# Patient Record
Sex: Male | Born: 1975 | Race: Black or African American | Hispanic: No | Marital: Married | Smoking: Current every day smoker
Health system: Southern US, Community
[De-identification: ages and names within clinical notes are randomized; demographics above are authoritative.]

## PROBLEM LIST (undated history)

## (undated) HISTORY — PX: ACHILLES TENDON REPAIR: SUR1153

---

## 1998-04-16 HISTORY — PX: PATELLAR TENDON REPAIR: SHX737

## 2002-03-23 ENCOUNTER — Emergency Department (HOSPITAL_COMMUNITY): Admission: EM | Admit: 2002-03-23 | Discharge: 2002-03-23 | Payer: Self-pay | Admitting: Emergency Medicine

## 2002-05-05 ENCOUNTER — Emergency Department (HOSPITAL_COMMUNITY): Admission: EM | Admit: 2002-05-05 | Discharge: 2002-05-05 | Payer: Self-pay | Admitting: Emergency Medicine

## 2002-05-07 ENCOUNTER — Emergency Department (HOSPITAL_COMMUNITY): Admission: EM | Admit: 2002-05-07 | Discharge: 2002-05-07 | Payer: Self-pay | Admitting: Emergency Medicine

## 2002-05-11 ENCOUNTER — Emergency Department (HOSPITAL_COMMUNITY): Admission: EM | Admit: 2002-05-11 | Discharge: 2002-05-11 | Payer: Self-pay | Admitting: Emergency Medicine

## 2002-12-14 ENCOUNTER — Emergency Department (HOSPITAL_COMMUNITY): Admission: EM | Admit: 2002-12-14 | Discharge: 2002-12-14 | Payer: Self-pay | Admitting: Emergency Medicine

## 2003-07-11 ENCOUNTER — Emergency Department (HOSPITAL_COMMUNITY): Admission: EM | Admit: 2003-07-11 | Discharge: 2003-07-11 | Payer: Self-pay | Admitting: Emergency Medicine

## 2003-07-14 ENCOUNTER — Observation Stay (HOSPITAL_COMMUNITY): Admission: RE | Admit: 2003-07-14 | Discharge: 2003-07-15 | Payer: Self-pay | Admitting: Orthopedic Surgery

## 2004-04-16 HISTORY — PX: HERNIA REPAIR: SHX51

## 2012-09-13 ENCOUNTER — Emergency Department (HOSPITAL_BASED_OUTPATIENT_CLINIC_OR_DEPARTMENT_OTHER)
Admission: EM | Admit: 2012-09-13 | Discharge: 2012-09-13 | Disposition: A | Discharge status: Home / Self Care | Payer: Self-pay | Attending: Emergency Medicine | Admitting: Emergency Medicine

## 2012-09-13 ENCOUNTER — Encounter (HOSPITAL_BASED_OUTPATIENT_CLINIC_OR_DEPARTMENT_OTHER): Payer: Self-pay | Admitting: *Deleted

## 2012-09-13 DIAGNOSIS — R42 Dizziness and giddiness: Secondary | ICD-10-CM | POA: Insufficient documentation

## 2012-09-13 DIAGNOSIS — F1010 Alcohol abuse, uncomplicated: Secondary | ICD-10-CM | POA: Insufficient documentation

## 2012-09-13 DIAGNOSIS — E860 Dehydration: Secondary | ICD-10-CM | POA: Insufficient documentation

## 2012-09-13 LAB — CBC WITH DIFFERENTIAL/PLATELET
Eosinophils Absolute: 0 10*3/uL (ref 0.0–0.7)
HCT: 38 % — ABNORMAL LOW (ref 39.0–52.0)
Hemoglobin: 13 g/dL (ref 13.0–17.0)
Lymphs Abs: 1 10*3/uL (ref 0.7–4.0)
MCH: 34.3 pg — ABNORMAL HIGH (ref 26.0–34.0)
Monocytes Relative: 8 % (ref 3–12)
Neutrophils Relative %: 75 % (ref 43–77)
RBC: 3.79 MIL/uL — ABNORMAL LOW (ref 4.22–5.81)

## 2012-09-13 LAB — BASIC METABOLIC PANEL
BUN: 14 mg/dL (ref 6–23)
Chloride: 99 mEq/L (ref 96–112)
GFR calc non Af Amer: >90 mL/min (ref >90)
Glucose, Bld: 101 mg/dL — ABNORMAL HIGH (ref 70–99)
Potassium: 3.7 mEq/L (ref 3.5–5.1)

## 2012-09-13 MED ORDER — SODIUM CHLORIDE 0.9 % IV BOLUS (SEPSIS): 1000.0000 mL | Freq: Once | INTRAVENOUS | Status: DC

## 2012-09-13 NOTE — ED Notes (Signed)
Patient states that he was feeling dizzy like he was going to pass out. Was walking outside when the feeling came on.

## 2012-09-13 NOTE — ED Provider Notes (Signed)
History     CSN: [id number]  Arrival date & time 09/13/12  1302   First MD Initiated Contact with Patient 09/13/12 1314      No chief complaint on file.   (Consider location/radiation/quality/duration/timing/severity/associated sxs/prior treatment) HPI Patient reports he feels lightheaded accompanied by dry mouth onset upon awakening this morning. He admits to drinking too much alcohol last night.. He denies pain anywhere denies other associated symptoms. Lightheadedness is worse with standing, not improved with anything. No treatment prior to coming here. No past medical history on file. Past medical history negative No past surgical history on file. Surgical history patellar tendon repair No family history on file. Family history negative History  Substance Use Topics  . Smoking status: Not on file  . Smokeless tobacco: Not on file  . Alcohol Use: Not on file     positive smoker occasional alcohol no drug use  Review of Systems  Constitutional: Negative.        Dry mouth  HENT: Negative.   Respiratory: Negative.   Cardiovascular: Negative.   Gastrointestinal: Negative.   Musculoskeletal: Negative.   Skin: Negative.   Neurological: Positive for light-headedness.  Psychiatric/Behavioral: Negative.   All other systems reviewed and are negative.    Allergies  Review of patient's allergies indicates not on file.  Home Medications  No current outpatient prescriptions on file.  There were no vitals taken for this visit.  Physical Exam  Nursing note and vitals reviewed. Constitutional: He is oriented to person, place, and time. He appears well-developed and well-nourished. No distress.  HENT:  Head: Normocephalic and atraumatic.  Dry mucous membranes  Eyes: Conjunctivae are normal. Pupils are equal, round, and reactive to light.  Neck: Neck supple. No tracheal deviation present. No thyromegaly present.  Cardiovascular: Normal rate and regular rhythm.   No  murmur heard. Pulmonary/Chest: Effort normal and breath sounds normal.  Abdominal: Soft. Bowel sounds are normal. He exhibits no distension. There is no tenderness.  Musculoskeletal: Normal range of motion. He exhibits no edema and no tenderness.  Neurological: He is alert and oriented to person, place, and time. No cranial nerve deficit. Coordination normal.  Gait normal  Skin: Skin is warm and dry. No rash noted.  Psychiatric: He has a normal mood and affect.    ED Course  Procedures (including critical care time)  Labs Reviewed - No data to display No results found.   No diagnosis found. Results for orders placed during the hospital encounter of 09/13/12  CBC WITH DIFFERENTIAL      Result Value Range   WBC 6.4  4.0 - 10.5 K/uL   RBC 3.79 (*) 4.22 - 5.81 MIL/uL   Hemoglobin 13.0  13.0 - 17.0 g/dL   HCT 40.9 (*) 81.1 - 91.4 %   MCV 100.3 (*) 78.0 - 100.0 fL   MCH 34.3 (*) 26.0 - 34.0 pg   MCHC 34.2  30.0 - 36.0 g/dL   RDW 78.2  95.6 - 21.3 %   Platelets 131 (*) 150 - 400 K/uL   Neutrophils Relative % 75  43 - 77 %   Neutro Abs 4.8  1.7 - 7.7 K/uL   Lymphocytes Relative 16  12 - 46 %   Lymphs Abs 1.0  0.7 - 4.0 K/uL   Monocytes Relative 8  3 - 12 %   Monocytes Absolute 0.5  0.1 - 1.0 K/uL   Eosinophils Relative 0  0 - 5 %   Eosinophils Absolute 0.0  0.0 -  0.7 K/uL   Basophils Relative 1  0 - 1 %   Basophils Absolute 0.0  0.0 - 0.1 K/uL  BASIC METABOLIC PANEL      Result Value Range   Sodium 137  135 - 145 mEq/L   Potassium 3.7  3.5 - 5.1 mEq/L   Chloride 99  96 - 112 mEq/L   CO2 25  19 - 32 mEq/L   Glucose, Bld 101 (*) 70 - 99 mg/dL   BUN 14  6 - 23 mg/dL   Creatinine, Ser 1.61  0.50 - 1.35 mg/dL   Calcium 9.7  8.4 - 09.6 mg/dL   GFR calc non Af Amer >90  >90 mL/min   GFR calc Af Amer >90  >90 mL/min   No results found.  Patient feels much improved after treatment with oral and intravenous fluids  MDM  Weakness is likely secondary to dehydration. Plan  referral resource guide. Encourage oral hydration. Avoid alcohol for 24 hours Diagnosis dehydration       Doug Sou, MD 09/13/12 [phone]

## 2013-06-07 ENCOUNTER — Encounter (HOSPITAL_BASED_OUTPATIENT_CLINIC_OR_DEPARTMENT_OTHER): Payer: Self-pay | Admitting: Emergency Medicine

## 2013-06-07 ENCOUNTER — Emergency Department (HOSPITAL_BASED_OUTPATIENT_CLINIC_OR_DEPARTMENT_OTHER): Payer: BC Managed Care – PPO

## 2013-06-07 ENCOUNTER — Emergency Department (HOSPITAL_BASED_OUTPATIENT_CLINIC_OR_DEPARTMENT_OTHER)
Admission: EM | Admit: 2013-06-07 | Discharge: 2013-06-07 | Disposition: A | Discharge status: Home / Self Care | Payer: BC Managed Care – PPO | Attending: Emergency Medicine | Admitting: Emergency Medicine

## 2013-06-07 DIAGNOSIS — IMO0002 Reserved for concepts with insufficient information to code with codable children: Secondary | ICD-10-CM

## 2013-06-07 DIAGNOSIS — Z23 Encounter for immunization: Secondary | ICD-10-CM | POA: Insufficient documentation

## 2013-06-07 DIAGNOSIS — Y929 Unspecified place or not applicable: Secondary | ICD-10-CM | POA: Insufficient documentation

## 2013-06-07 DIAGNOSIS — S61209A Unspecified open wound of unspecified finger without damage to nail, initial encounter: Secondary | ICD-10-CM | POA: Insufficient documentation

## 2013-06-07 DIAGNOSIS — F17200 Nicotine dependence, unspecified, uncomplicated: Secondary | ICD-10-CM | POA: Insufficient documentation

## 2013-06-07 DIAGNOSIS — Y9389 Activity, other specified: Secondary | ICD-10-CM | POA: Insufficient documentation

## 2013-06-07 DIAGNOSIS — W2209XA Striking against other stationary object, initial encounter: Secondary | ICD-10-CM | POA: Insufficient documentation

## 2013-06-07 MED ORDER — TETANUS-DIPHTH-ACELL PERTUSSIS 5-2.5-18.5 LF-MCG/0.5 IM SUSP
0.5000 mL | Freq: Once | INTRAMUSCULAR | Status: AC
  Administered 2013-06-07: 0.5 mL via INTRAMUSCULAR
  Filled 2013-06-07: qty 0.5

## 2013-06-07 NOTE — Discharge Instructions (Signed)

## 2013-06-07 NOTE — ED Provider Notes (Signed)
CSN: [id number]     Arrival date & time 06/07/13  1046 History   First MD Initiated Contact with Patient 06/07/13 1108     Chief Complaint  Patient presents with  . Extremity Laceration     (Consider location/radiation/quality/duration/timing/severity/associated sxs/prior Treatment) HPI  This a 38 year old male with no significant past medical history who presents with lacerations to the right hand. Patient reports that approximately 1 AM this morning he punched a mirror and sustained cuts to the right third and fourth digit. He cleaned the cuts. He has noted continued bleeding.  He is left-handed. He is unsure when his last tetanus shot was. He denies any other injury.  No past medical history on file. Past Surgical History  Procedure Laterality Date  . Patellar tendon repair     No family history on file. History  Substance Use Topics  . Smoking status: Current Every Day Smoker  . Smokeless tobacco: Not on file  . Alcohol Use: Yes    Review of Systems  Musculoskeletal:       Hand pain  Skin: Positive for wound.  All other systems reviewed and are negative.      Allergies  Review of patient's allergies indicates no known allergies.  Home Medications  No current outpatient prescriptions on file. BP 155/90  Pulse 98  Temp(Src) 98.8 F (37.1 C) (Oral)  Resp 14  Ht 6\' 1"  (1.854 m)  Wt 180 lb (81.647 kg)  BMI 23.75 kg/m2  SpO2 100% Physical Exam  Nursing note and vitals reviewed. Constitutional: He is oriented to person, place, and time. He appears well-developed and well-nourished.  HENT:  Head: Normocephalic and atraumatic.  Cardiovascular: Normal rate and regular rhythm.   Pulmonary/Chest: Effort normal. No respiratory distress.  Musculoskeletal: He exhibits no edema.  Lymphadenopathy:    He has no cervical adenopathy.  Neurological: He is alert and oriented to person, place, and time.  Skin: Skin is warm and dry.  #1:  Laceration just distal to the PIP  joint on the third digit on the dorsum of the hand, bleeding controlled, proximally 2 cm and V-shaped with flap #2: Laceration just distal to PIP joint on the fourth digit on the dorsal of the right hand, bleeding controlled, approximately 1 cm, flap noted  Psychiatric: He has a normal mood and affect.    ED Course  Procedures (including critical care time) Labs Review Labs Reviewed - No data to display Imaging Review Dg Hand Complete Right  06/07/2013   CLINICAL DATA:  Laceration, trauma  EXAM: RIGHT HAND - COMPLETE 3+ VIEW  COMPARISON:  None.  FINDINGS: Soft tissue lacerations of the third and fourth digits. Small radiopaque foreign body noted within the soft tissue injury of the right third digit overlying the dorsal aspect of the third middle phalanx. No underlying fracture or malalignment.  IMPRESSION: Soft tissue lacerations right third and fourth fingers.  Small punctate radiopaque foreign body right third finger as described  No acute osseous finding   Electronically Signed   By: Ruel Favors M.D.   On: 06/07/2013 11:48    EKG Interpretation   None      LACERATION REPAIR Performed by: Ross Marcus, F Authorized by: Ross Marcus, F Consent: Verbal consent obtained. Risks and benefits: risks, benefits and alternatives were discussed Consent given by: patient Patient identity confirmed: provided demographic data Prepped and Draped in normal sterile fashion Wound explored  Laceration Location: right 3rd, 4th  Laceration Length: 2cm  No Foreign Bodies seen  or palpated  Anesthesia: local infiltration  Local anesthetic: lidocaine 1% wo epinephrine  Anesthetic total: 2 ml  Irrigation method: syringe Amount of cleaning: standard  Skin closure: 5-0 nylon  Number of sutures: 4  Technique: interrupted  Patient tolerance: Patient tolerated the procedure well with no immediate complications.  MDM   Final diagnoses:  Laceration   Patient presents with  lacerations to the right third and fourth digit. He is nontoxic-appearing and no other signs of injury. X-rays are negative for acute fracture and patient is tendons appear to be intact. There is possible foreign body noted on x-ray. Patient's wounds were irrigated and debrided. Upon body noted. Closed loosely as above given injury occurred >10 hrs prior to arrival. Patient's tetanus was updated. He was given strict return precautions. He is to have stitches removed in 7-10 days.  After history, exam, and medical workup I feel the patient has been appropriately medically screened and is safe for discharge home. Pertinent diagnoses were discussed with the patient. Patient was given return precautions.      Shon Batonourtney F Ruston Fedora, MD 06/07/13 [phone]

## 2013-06-07 NOTE — ED Notes (Signed)
Laceration to right middle and ring finger that occurred at 2 am.  Bleeding controlled.

## 2016-12-31 ENCOUNTER — Emergency Department (HOSPITAL_BASED_OUTPATIENT_CLINIC_OR_DEPARTMENT_OTHER): Payer: 59

## 2016-12-31 ENCOUNTER — Encounter (HOSPITAL_BASED_OUTPATIENT_CLINIC_OR_DEPARTMENT_OTHER): Payer: Self-pay

## 2016-12-31 ENCOUNTER — Emergency Department (HOSPITAL_BASED_OUTPATIENT_CLINIC_OR_DEPARTMENT_OTHER)
Admission: EM | Admit: 2016-12-31 | Discharge: 2017-01-01 | Disposition: A | Discharge status: Home / Self Care | Payer: 59 | Attending: Emergency Medicine | Admitting: Emergency Medicine

## 2016-12-31 DIAGNOSIS — K5732 Diverticulitis of large intestine without perforation or abscess without bleeding: Secondary | ICD-10-CM | POA: Insufficient documentation

## 2016-12-31 DIAGNOSIS — R1031 Right lower quadrant pain: Secondary | ICD-10-CM

## 2016-12-31 DIAGNOSIS — F17210 Nicotine dependence, cigarettes, uncomplicated: Secondary | ICD-10-CM | POA: Diagnosis not present

## 2016-12-31 DIAGNOSIS — K5792 Diverticulitis of intestine, part unspecified, without perforation or abscess without bleeding: Secondary | ICD-10-CM

## 2016-12-31 LAB — COMPREHENSIVE METABOLIC PANEL
ALBUMIN: 4.7 g/dL (ref 3.5–5.0)
ALT: 116 U/L — ABNORMAL HIGH (ref 17–63)
ANION GAP: 12 (ref 5–15)
AST: 75 U/L — ABNORMAL HIGH (ref 15–41)
Alkaline Phosphatase: 63 U/L (ref 38–126)
BILIRUBIN TOTAL: 1.9 mg/dL — AB (ref 0.3–1.2)
BUN: 12 mg/dL (ref 6–20)
CO2: 27 mmol/L (ref 22–32)
Calcium: 9.7 mg/dL (ref 8.9–10.3)
Chloride: 92 mmol/L — ABNORMAL LOW (ref 101–111)
Creatinine, Ser: 0.72 mg/dL (ref 0.61–1.24)
Glucose, Bld: 78 mg/dL (ref 65–99)
POTASSIUM: 4.2 mmol/L (ref 3.5–5.1)
Sodium: 131 mmol/L — ABNORMAL LOW (ref 135–145)
TOTAL PROTEIN: 9.3 g/dL — AB (ref 6.5–8.1)

## 2016-12-31 MED ORDER — SODIUM CHLORIDE 0.9 % IV BOLUS (SEPSIS)
1000.0000 mL | Freq: Once | INTRAVENOUS | Status: AC
  Administered 2016-12-31: 1000 mL via INTRAVENOUS

## 2016-12-31 MED ORDER — ONDANSETRON HCL 4 MG/2ML IJ SOLN
4.0000 mg | Freq: Once | INTRAMUSCULAR | Status: AC
  Administered 2016-12-31: 4 mg via INTRAVENOUS
  Filled 2016-12-31: qty 2

## 2016-12-31 MED ORDER — MORPHINE SULFATE (PF) 4 MG/ML IV SOLN
4.0000 mg | Freq: Once | INTRAVENOUS | Status: AC
  Administered 2016-12-31: 4 mg via INTRAVENOUS
  Filled 2016-12-31: qty 1

## 2016-12-31 NOTE — ED Provider Notes (Signed)
[facility]   CSN: [id number] Arrival date & time: 12/31/16  2116     History   Chief Complaint Chief Complaint  Patient presents with  . Abdominal Pain    HPI Benjamin Larson is a 41 y.o. male.  HPI  Benjamin Larson is a 41yo male with no significant past medical history who presents to the Emergency Department for evaluation of right lower quadrant pain. Patient states that pain developed gradually two days ago. Pain feels like a dull ache, but becomes sharp and stabbing when he sits up or rotates at his waist. He also had a tactile fever yesterday and today. He states that he tried taking Bayer back and body pain reliever, which did not help. Last bowel movement was yesterday and normal for him. No nausea, vomiting, diarrhea, hematochezia, dysuria, hematuria, testicular pain, back pain, shortness of breath, chest pain. He denies previous abdominal surgeries.   History reviewed. No pertinent past medical history.  There are no active problems to display for this patient.   Past Surgical History:  Procedure Laterality Date  . ACHILLES TENDON REPAIR    . HERNIA REPAIR    . PATELLAR TENDON REPAIR         Home Medications    Prior to Admission medications   Not on File    Family History No family history on file.  Social History Social History  Substance Use Topics  . Smoking status: Current Every Day Smoker    Types: Cigarettes  . Smokeless tobacco: Never Used  . Alcohol use Yes     Comment: daily     Allergies   Patient has no known allergies.   Review of Systems Review of Systems  Constitutional: Positive for chills, diaphoresis and fever.  HENT: Negative for trouble swallowing.   Eyes: Negative for visual disturbance.  Respiratory: Negative for chest tightness and shortness of breath.   Cardiovascular: Negative for chest pain.  Gastrointestinal: Positive for abdominal pain. Negative for constipation, diarrhea, nausea and  vomiting.  Genitourinary: Negative for difficulty urinating, dysuria, flank pain, hematuria, testicular pain and urgency.  Musculoskeletal: Negative for back pain.  Skin: Negative for rash and wound.  Neurological: Negative for weakness, numbness and headaches.  Psychiatric/Behavioral: Negative for agitation.     Physical Exam Updated Vital Signs BP (!) 143/93 (BP Location: Right Arm)   Pulse 78   Temp 99.1 F (37.3 C) (Oral)   Resp 16   Ht 6' (1.829 m)   Wt 78.9 kg (174 lb)   SpO2 100%   BMI 23.60 kg/m   Physical Exam  Constitutional: He is oriented to person, place, and time. He appears well-developed and well-nourished. No distress.  HENT:  Head: Normocephalic and atraumatic.  Mouth/Throat: Oropharynx is clear and moist.  Eyes: Pupils are equal, round, and reactive to light. Conjunctivae are normal. Right eye exhibits no discharge. Left eye exhibits no discharge. No scleral icterus.  Neck: Normal range of motion. Neck supple.  Cardiovascular: Normal rate and regular rhythm.  Exam reveals no gallop and no friction rub.   No murmur heard. Pulmonary/Chest: Effort normal and breath sounds normal. No respiratory distress. He has no wheezes. He has no rales.  Abdominal: Soft. Bowel sounds are normal. He exhibits no distension. There is no rebound.  Patient acutely tender to palpation in right lower quadrant with guarding. McBurney point positive. Murphy's sign negative. No CVA tenderness.   Musculoskeletal: Normal range of motion.  Neurological: He is alert and  oriented to person, place, and time. Coordination normal.  Skin: Skin is warm and dry. Capillary refill takes less than 2 seconds. He is not diaphoretic.  Psychiatric: He has a normal mood and affect.     ED Treatments / Results  Labs (all labs ordered are listed, but only abnormal results are displayed) Labs Reviewed  CBC - Abnormal; Notable for the following:       Result Value   RBC 3.66 (*)    HCT 36.4 (*)     MCH 35.5 (*)    Platelets 92 (*)    All other components within normal limits  COMPREHENSIVE METABOLIC PANEL - Abnormal; Notable for the following:    Sodium 131 (*)    Chloride 92 (*)    Total Protein 9.3 (*)    AST 75 (*)    ALT 116 (*)    Total Bilirubin 1.9 (*)    All other components within normal limits  LIPASE, BLOOD  URINALYSIS, ROUTINE W REFLEX MICROSCOPIC    EKG  EKG Interpretation None       Radiology No results found.  Procedures Procedures (including critical care time)  Medications Ordered in ED Medications  iopamidol (ISOVUE-300) 61 % injection 100 mL (not administered)  sodium chloride 0.9 % bolus 1,000 mL (0 mLs Intravenous Stopped 01/01/17 0029)  morphine 4 MG/ML injection 4 mg (4 mg Intravenous Given 12/31/16 2340)  ondansetron (ZOFRAN) injection 4 mg (4 mg Intravenous Given 12/31/16 2340)     Initial Impression / Assessment and Plan / ED Course  I have reviewed the triage vital signs and the nursing notes.  Pertinent labs & imaging results that were available during my care of the patient were reviewed by me and considered in my medical decision making (see chart for details).   Patient acutely tender to palpation over RLQ, McBurney point positive. Suspect appendicitis. Labs and CT scan ordered to evaluate for this.  Labs reviewed, patient is mildly hyponatremic (Na 131) this should improve with NS bolus. Lipase negative, do not suspect pancreatitis. Liver enzymes mildly elevated. Patient initially tachycardic (pulse 107), this came down to 78 with fluid bolus.   End of shift. Sign out to Dr. Wilkie Aye who will disposition patient after CT scan.   Final Clinical Impressions(s) / ED Diagnoses   Final diagnoses:  Right lower quadrant abdominal pain    New Prescriptions New Prescriptions   No medications on file     Benjamin Larson 01/01/17 0126    Shon Baton, MD 01/01/17 [phone]

## 2016-12-31 NOTE — ED Triage Notes (Signed)
C/o RLQ pain x days-NAD-steady gait

## 2016-12-31 NOTE — ED Notes (Signed)
ED Provider at bedside. 

## 2017-01-01 LAB — URINALYSIS, MICROSCOPIC (REFLEX): Bacteria, UA: NONE SEEN

## 2017-01-01 LAB — URINALYSIS, ROUTINE W REFLEX MICROSCOPIC
Bilirubin Urine: NEGATIVE
Glucose, UA: NEGATIVE mg/dL
KETONES UR: 15 mg/dL — AB
LEUKOCYTES UA: NEGATIVE
NITRITE: NEGATIVE
PROTEIN: 100 mg/dL — AB
Specific Gravity, Urine: 1.02 (ref 1.005–1.030)
pH: 6 (ref 5.0–8.0)

## 2017-01-01 LAB — CBC
HCT: 36.4 % — ABNORMAL LOW (ref 39.0–52.0)
Hemoglobin: 13 g/dL (ref 13.0–17.0)
MCH: 35.5 pg — ABNORMAL HIGH (ref 26.0–34.0)
MCHC: 35.7 g/dL (ref 30.0–36.0)
MCV: 99.5 fL (ref 78.0–100.0)
PLATELETS: 92 10*3/uL — AB (ref 150–400)
RBC: 3.66 MIL/uL — ABNORMAL LOW (ref 4.22–5.81)
RDW: 12.4 % (ref 11.5–15.5)
WBC: 9.4 10*3/uL (ref 4.0–10.5)

## 2017-01-01 LAB — LIPASE, BLOOD: LIPASE: 24 U/L (ref 11–51)

## 2017-01-01 MED ORDER — IOPAMIDOL (ISOVUE-300) INJECTION 61%
100.0000 mL | Freq: Once | INTRAVENOUS | Status: AC | PRN
  Administered 2017-01-01: 100 mL via INTRAVENOUS

## 2017-01-01 MED ORDER — METRONIDAZOLE 500 MG PO TABS
500.0000 mg | ORAL_TABLET | Freq: Once | ORAL | Status: AC
  Administered 2017-01-01: 500 mg via ORAL
  Filled 2017-01-01: qty 1

## 2017-01-01 MED ORDER — CIPROFLOXACIN HCL 500 MG PO TABS: 500.0000 mg | ORAL_TABLET | Freq: Two times a day (BID) | ORAL | 0 refills | Status: DC

## 2017-01-01 MED ORDER — OXYCODONE-ACETAMINOPHEN 5-325 MG PO TABS
2.0000 | ORAL_TABLET | Freq: Once | ORAL | Status: AC
  Administered 2017-01-01: 2 via ORAL
  Filled 2017-01-01: qty 2

## 2017-01-01 MED ORDER — OXYCODONE-ACETAMINOPHEN 5-325 MG PO TABS: 2.0000 | ORAL_TABLET | ORAL | 0 refills | Status: DC | PRN

## 2017-01-01 MED ORDER — ONDANSETRON 4 MG PO TBDP: 4.0000 mg | ORAL_TABLET | Freq: Three times a day (TID) | ORAL | 0 refills | Status: DC | PRN

## 2017-01-01 MED ORDER — METRONIDAZOLE 500 MG PO TABS: 500.0000 mg | ORAL_TABLET | Freq: Two times a day (BID) | ORAL | 0 refills | Status: DC

## 2017-01-01 MED ORDER — CIPROFLOXACIN HCL 500 MG PO TABS
500.0000 mg | ORAL_TABLET | Freq: Once | ORAL | Status: AC
  Administered 2017-01-01: 500 mg via ORAL
  Filled 2017-01-01: qty 1

## 2017-01-01 NOTE — Discharge Instructions (Signed)
You were seen today for abdominal pain. You have some thickening of your colon to suggest diverticulitis.  Inflammatory bowel disease is also consideration. Take antibiotics and pain medications as instructed. Follow-up with gastroenterology as you may need a colonoscopy once your symptoms have improved.

## 2017-01-01 NOTE — ED Notes (Signed)
Pt drank entire cup of water with his meds.  Does not want anything else to drink.

## 2018-06-18 ENCOUNTER — Other Ambulatory Visit: Payer: Self-pay

## 2018-06-18 ENCOUNTER — Emergency Department (HOSPITAL_COMMUNITY)
Admission: EM | Admit: 2018-06-18 | Discharge: 2018-06-18 | Disposition: A | Discharge status: Home / Self Care | Payer: 59 | Attending: Emergency Medicine | Admitting: Emergency Medicine

## 2018-06-18 ENCOUNTER — Emergency Department (HOSPITAL_COMMUNITY): Payer: 59

## 2018-06-18 DIAGNOSIS — R569 Unspecified convulsions: Secondary | ICD-10-CM | POA: Diagnosis not present

## 2018-06-18 DIAGNOSIS — Z79899 Other long term (current) drug therapy: Secondary | ICD-10-CM | POA: Diagnosis not present

## 2018-06-18 DIAGNOSIS — F17210 Nicotine dependence, cigarettes, uncomplicated: Secondary | ICD-10-CM | POA: Diagnosis not present

## 2018-06-18 DIAGNOSIS — R258 Other abnormal involuntary movements: Secondary | ICD-10-CM | POA: Diagnosis present

## 2018-06-18 LAB — CBC WITH DIFFERENTIAL/PLATELET
Abs Immature Granulocytes: 0.03 K/uL (ref 0.00–0.07)
Basophils Absolute: 0 K/uL (ref 0.0–0.1)
Basophils Relative: 0 %
Eosinophils Absolute: 0 K/uL (ref 0.0–0.5)
Eosinophils Relative: 1 %
HCT: 35.2 % — ABNORMAL LOW (ref 39.0–52.0)
Hemoglobin: 11.2 g/dL — ABNORMAL LOW (ref 13.0–17.0)
Immature Granulocytes: 1 %
Lymphocytes Relative: 19 %
Lymphs Abs: 0.8 K/uL (ref 0.7–4.0)
MCH: 28.6 pg (ref 26.0–34.0)
MCHC: 31.8 g/dL (ref 30.0–36.0)
MCV: 89.8 fL (ref 80.0–100.0)
Monocytes Absolute: 0.5 K/uL (ref 0.1–1.0)
Monocytes Relative: 13 %
Neutro Abs: 2.8 K/uL (ref 1.7–7.7)
Neutrophils Relative %: 66 %
Platelets: 58 K/uL — ABNORMAL LOW (ref 150–400)
RBC: 3.92 MIL/uL — ABNORMAL LOW (ref 4.22–5.81)
RDW: 18.3 % — ABNORMAL HIGH (ref 11.5–15.5)
WBC: 4.2 K/uL (ref 4.0–10.5)
nRBC: 0 % (ref 0.0–0.2)

## 2018-06-18 LAB — URINALYSIS, ROUTINE W REFLEX MICROSCOPIC
Bilirubin Urine: NEGATIVE
Glucose, UA: NEGATIVE mg/dL
Hgb urine dipstick: NEGATIVE
Ketones, ur: 20 mg/dL — AB
Leukocytes,Ua: NEGATIVE
Nitrite: NEGATIVE
Protein, ur: 100 mg/dL — AB
Specific Gravity, Urine: 1.027 (ref 1.005–1.030)
pH: 6 (ref 5.0–8.0)

## 2018-06-18 LAB — RAPID URINE DRUG SCREEN, HOSP PERFORMED
Amphetamines: NOT DETECTED
Barbiturates: NOT DETECTED
Benzodiazepines: NOT DETECTED
Cocaine: NOT DETECTED
OPIATES: NOT DETECTED
Tetrahydrocannabinol: NOT DETECTED

## 2018-06-18 LAB — COMPREHENSIVE METABOLIC PANEL
ALK PHOS: 57 U/L (ref 38–126)
ALT: 83 U/L — AB (ref 0–44)
AST: 133 U/L — AB (ref 15–41)
Albumin: 4.2 g/dL (ref 3.5–5.0)
Anion gap: 12 (ref 5–15)
BILIRUBIN TOTAL: 1.6 mg/dL — AB (ref 0.3–1.2)
BUN: 10 mg/dL (ref 6–20)
CALCIUM: 9.9 mg/dL (ref 8.9–10.3)
CO2: 24 mmol/L (ref 22–32)
CREATININE: 1.04 mg/dL (ref 0.61–1.24)
Chloride: 99 mmol/L (ref 98–111)
GFR calc Af Amer: >60 mL/min (ref >60)
Glucose, Bld: 107 mg/dL — ABNORMAL HIGH (ref 70–99)
Potassium: 3.9 mmol/L (ref 3.5–5.1)
Sodium: 135 mmol/L (ref 135–145)
TOTAL PROTEIN: 7.3 g/dL (ref 6.5–8.1)

## 2018-06-18 LAB — ETHANOL: Alcohol, Ethyl (B): <10 mg/dL

## 2018-06-18 MED ORDER — SODIUM CHLORIDE 0.9 % IV BOLUS
1000.0000 mL | Freq: Once | INTRAVENOUS | Status: AC
  Administered 2018-06-18: 1000 mL via INTRAVENOUS

## 2018-06-18 MED ORDER — TRAMADOL HCL 50 MG PO TABS: 50.0000 mg | ORAL_TABLET | Freq: Four times a day (QID) | ORAL | 0 refills | Status: DC | PRN

## 2018-06-18 NOTE — ED Provider Notes (Signed)
[facility] EMERGENCY DEPARTMENT Provider Note   CSN: [id number] Arrival date & time: 06/18/18  1750    History   Chief Complaint Chief Complaint  Patient presents with  . Seizures    HPI Benjamin Larson is a 43 y.o. male.     Patient is a 43 year old male with no significant past medical history.  He is brought by EMS from work for evaluation of seizure activity.  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  While he was there, he apparently fell to the floor and began having convulsions.  This was said to have lasted for 2 minutes, then resolved.  The patient was somewhat confused afterward but is now awake, alert, and oriented.  The patient denies any seizure history.  He does admit to daily alcohol consumption.  He states that he drinks usually a sixpack plus assorted liquor daily.  He denies any illicit drug use.  The history is provided by the patient.  Seizures  Seizure activity on arrival: no   Seizure type:  Grand mal Initial focality:  None Postictal symptoms: no confusion   Return to baseline: yes   Severity:  Moderate Timing:  Once Progression:  Resolved   No past medical history on file.  There are no active problems to display for this patient.   Past Surgical History:  Procedure Laterality Date  . ACHILLES TENDON REPAIR    . HERNIA REPAIR    . PATELLAR TENDON REPAIR          Home Medications    Prior to Admission medications   Medication Sig Start Date End Date Taking? Authorizing Provider  ciprofloxacin (CIPRO) 500 MG tablet Take 1 tablet (500 mg total) by mouth 2 (two) times daily. 01/01/17   Horton, Mayer Masker, MD  metroNIDAZOLE (FLAGYL) 500 MG tablet Take 1 tablet (500 mg total) by mouth 2 (two) times daily. 01/01/17   Horton, Mayer Masker, MD  ondansetron (ZOFRAN ODT) 4 MG disintegrating tablet Take 1 tablet (4 mg total) by mouth every 8 (eight) hours as  needed for nausea or vomiting. 01/01/17   Horton, Mayer Masker, MD  oxyCODONE-acetaminophen (PERCOCET/ROXICET) 5-325 MG tablet Take 2 tablets by mouth every 4 (four) hours as needed for severe pain. 01/01/17   Horton, Mayer Masker, MD    Family History No family history on file.  Social History Social History   Tobacco Use  . Smoking status: Current Every Day Smoker    Types: Cigarettes  . Smokeless tobacco: Never Used  Substance Use Topics  . Alcohol use: Yes    Comment: daily  . Drug use: No     Allergies   Patient has no known allergies.   Review of Systems Review of Systems  Neurological: Positive for seizures.  All other systems reviewed and are negative.    Physical Exam Updated Vital Signs BP (!) 148/105   Pulse 84   Temp 99.1 F (37.3 C) (Oral)   Resp 19   SpO2 99%   Physical Exam Vitals signs and nursing note reviewed.  Constitutional:      General: He is not in acute distress.    Appearance: He is well-developed. He is not diaphoretic.  HENT:     Head: Normocephalic and atraumatic.     Mouth/Throat:     Comments: There are bite marks to the right side of his tongue. Eyes:  Extraocular Movements: Extraocular movements intact.     Pupils: Pupils are equal, round, and reactive to light.  Neck:     Musculoskeletal: Normal range of motion and neck supple.  Cardiovascular:     Rate and Rhythm: Normal rate and regular rhythm.     Heart sounds: No murmur. No friction rub.  Pulmonary:     Effort: Pulmonary effort is normal. No respiratory distress.     Breath sounds: Normal breath sounds. No wheezing or rales.  Abdominal:     General: Bowel sounds are normal. There is no distension.     Palpations: Abdomen is soft.     Tenderness: There is no abdominal tenderness.  Musculoskeletal: Normal range of motion.  Skin:    General: Skin is warm and dry.  Neurological:     General: No focal deficit present.     Mental Status: He is alert and oriented to  person, place, and time.     Cranial Nerves: No cranial nerve deficit.     Sensory: No sensory deficit.     Motor: No weakness.     Coordination: Coordination normal.      ED Treatments / Results  Labs (all labs ordered are listed, but only abnormal results are displayed) Labs Reviewed  COMPREHENSIVE METABOLIC PANEL  ETHANOL  CBC WITH DIFFERENTIAL/PLATELET  URINALYSIS, ROUTINE W REFLEX MICROSCOPIC  RAPID URINE DRUG SCREEN, HOSP PERFORMED    EKG None  Radiology No results found.  Procedures Procedures (including critical care time)  Medications Ordered in ED Medications  sodium chloride 0.9 % bolus 1,000 mL (has no administration in time range)     Initial Impression / Assessment and Plan / ED Course  I have reviewed the triage vital signs and the nursing notes.  Pertinent labs & imaging results that were available during my care of the patient were reviewed by me and considered in my medical decision making (see chart for details).  Patient brought here by EMS after experiencing a seizure at work.  The patient's work-up shows no obvious abnormality.  His head CT is negative and laboratory studies are unremarkable.  The patient does consume alcohol daily and I suspect this is related.  I have discussed the care with Dr. Amada Jupiter from neurology.  He is advised no driving for 6 months and outpatient follow-up with neurology for further evaluation.  He will be given the number for outpatient neurology and is to follow-up in the next week.  Final Clinical Impressions(s) / ED Diagnoses   Final diagnoses:  None    ED Discharge Orders    None       Geoffery Lyons, MD 06/18/18 2108

## 2018-06-18 NOTE — ED Notes (Signed)
Patient verbalizes understanding of discharge instructions. Opportunity for questioning and answers were provided. Armband removed by staff, pt discharged from ED.  

## 2018-06-18 NOTE — ED Triage Notes (Addendum)
Pt arrived via [facility] from work after coworkers witnessed pt have a seizure lasting approx 2 mins followed by a period of lethargy. Pt is alert and oriented at this time. No trauma noted or reported by pt. Pt has no hx of sz activity. EMS reports pt reported daily ETOH use. 174/90, hr110, rr20, sp02 98%.Pt also reporting mild chest pain, 2/10. Denies radiation.

## 2018-06-18 NOTE — Discharge Instructions (Addendum)
No driving for 6 months, or until cleared by your neurologist.  Follow-up with neurology as an outpatient.  The contact information for both [facility] and [facility] have been provided in this discharge summary for you to make an appointment with whichever practice can get you seen the fastest.  Return to the emergency department if you experience any additional problems.

## 2018-06-23 NOTE — Progress Notes (Signed)
[facility]    Provider:  Dr Lucia Gaskins Referring Provider: ED Primary Care Provider:  Patient, No Pcp Per  CC: Seizures in the setting of alcohol abuse/withdrawal  HPI:  Benjamin Larson is a 43 y.o. male here as requested from the emergency room for seizures.  After reviewing the chart there seems to be history of alcohol abuse and he is a daily alcohol user, he drinks usually a sixpack plus assorted liquor daily.  He is a current every day smoker.  He was seen in the emergency room June 18, 2018 with a seizure with suspected alcohol etiology.  He was told not to drive for 6 months and to follow-up in neurology.  He was apparently also seen in the emergency room in May 2014 after "drinking too much alcohol last night".  In the ED earlier this month his alcohol level was less than 10 consistent with possible withdrawal seizure. He had been drinking the night before and he was at work when he had the seizures. He drinks a 6-pack of beer and liquor a day, he also smokes, been drinking for years Discussed that going "cold [place]" or even decreasing alcohol too quickly can cause seizures but also withdrawal and death. Never had a seizure before. He doesn't remember anything, just woke up and EMS was there. Bit his tongue Benjamin Larson said the seizure lasted 2 minutes, confused afterwards, no urination, no injuries, he was foaming at the mouth. Father also had a seizure at one time when he tried to stop drinking alcohol "cold [place]". Never had a previous seizure and none since. No residual deficits. He had not eaten that day and felt lightheaded. No other focal neurologic deficits, associated symptoms, inciting events or modifiable factors.  Reviewed notes, labs and imaging from outside physicians, which showed:   Urine rapid drug screen was negative, ethanol less than 10 consistent with withdrawal. Reviewed notes from the emergency room.  On June 18, 2018 patient was brought by EMS from work for  evaluation of seizure activity.  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  While he was there he apparently fell to the floor and began having convulsions.  The patient was somewhat confused afterwards.  He denied any seizure history.  He does admit to daily alcohol consumption.  Bite marks were seen to the right side of his tongue.  In the emergency room he was not altered however anymore.  Head CT was negative and laboratory studies were unremarkable.  The emergency room suspected alcohol as a etiology for his seizure.  No driving for 6 months.  CT head 06/18/2018:t showed No acute intracranial abnormalities including mass lesion or mass effect, hydrocephalus, extra-axial fluid collection, midline shift, hemorrhage, or acute infarction, large ischemic events (personally reviewed images)  Elevated AST and ALT BUN 10, Creat 1.04 Anemic 11.2/35.2  Review of Systems: Patient complains of symptoms per HPI as well as the following symptoms: seizure. Pertinent negatives and positives per HPI. All others negative.   Social History   Socioeconomic History  . Marital status: Married    Spouse name: Not on file  . Number of children: 2  . Years of education: Not on file  . Highest education level: High school graduate  Occupational History  . Not on file  Social Needs  . Financial resource strain: Not on file  . Food insecurity:    Worry: Not on file  Inability: Not on file  . Transportation needs:    Medical: Not on file    Non-medical: Not on file  Tobacco Use  . Smoking status: Current Every Day Smoker    Packs/day: 0.50    Types: Cigarettes  . Smokeless tobacco: Never Used  Substance and Sexual Activity  . Alcohol use: Yes    Alcohol/week: 28.0 - 35.0 standard drinks    Types: 28 - 35 Standard drinks or equivalent per week    Comment: daily 4-5 drinks   . Drug use: No  . Sexual activity: Not on file  Lifestyle  . Physical  activity:    Days per week: Not on file    Minutes per session: Not on file  . Stress: Not on file  Relationships  . Social connections:    Talks on phone: Not on file    Gets together: Not on file    Attends religious service: Not on file    Active member of club or organization: Not on file    Attends meetings of clubs or organizations: Not on file    Relationship status: Not on file  . Intimate partner violence:    Fear of current or ex partner: Not on file    Emotionally abused: Not on file    Physically abused: Not on file    Forced sexual activity: Not on file  Other Topics Concern  . Not on file  Social History Narrative   Lives at home with his wife   Left handed   Caffeine: about 1 cup of coffee daily, tries to stay away from sodas    Family History  Problem Relation Age of Onset  . Heart attack Mother   . Stroke Mother   . Diabetes Mother        pt thinks she has diabetes  . Seizures Neg Hx     History reviewed. No pertinent past medical history.  Patient Active Problem List   Diagnosis Date Noted  . Alcohol abuse 06/24/2018  . Seizure (HCC) 06/24/2018  . Tobacco abuse 06/24/2018    Past Surgical History:  Procedure Laterality Date  . ACHILLES TENDON REPAIR Right   . HERNIA REPAIR  2006   groin  . PATELLAR TENDON REPAIR Right 2000    Current Outpatient Medications  Medication Sig Dispense Refill  . ciprofloxacin (CIPRO) 500 MG tablet Take 1 tablet (500 mg total) by mouth 2 (two) times daily. (Patient not taking: Reported on 06/18/2018) 28 tablet 0  . metroNIDAZOLE (FLAGYL) 500 MG tablet Take 1 tablet (500 mg total) by mouth 2 (two) times daily. (Patient not taking: Reported on 06/18/2018) 28 tablet 0   No current facility-administered medications for this visit.     Allergies as of 06/24/2018  . (No Known Allergies)    Vitals: BP 126/82 (BP Location: Right Arm, Patient Position: Sitting)   Pulse 81   Ht 6' (1.829 m)   Wt 186 lb (84.4 kg)   BMI  25.23 kg/m  Last Weight:  Wt Readings from Last 1 Encounters:  06/24/18 186 lb (84.4 kg)   Last Height:   Ht Readings from Last 1 Encounters:  06/24/18 6' (1.829 m)     Physical exam: Exam: Gen: NAD, conversant                     CV: RRR, no MRG. No Carotid Bruits. No peripheral edema, warm, nontender Eyes: Conjunctivae clear without exudates or hemorrhage  Neuro: Detailed  Neurologic Exam  Speech:    Speech is normal; fluent and spontaneous with normal comprehension.  Cognition:    The patient is oriented to person, place, and time;     recent and remote memory intact;     language fluent;     normal attention, concentration,     fund of knowledge Cranial Nerves:    The pupils are equal, round, and reactive to light. Attempted fundoscopy could not visualize. Visual fields are full to finger confrontation. Extraocular movements are intact. Trigeminal sensation is intact and the muscles of mastication are normal. The face is symmetric. The palate elevates in the midline. Hearing intact. Voice is normal. Shoulder shrug is normal. The tongue has normal motion without fasciculations.   Coordination:    Normal finger to nose   Gait:    Heel-toe and tandem gait are normal.   Motor Observation:    No asymmetry, no atrophy, and no involuntary movements noted. Tone:    Normal muscle tone.    Posture:    Posture is normal. normal erect    Strength:    Strength is V/V in the upper and lower limbs.      Sensation: intact to LT     Reflex Exam:  DTR's:    Right Patellar decreased otherwise deep tendon reflexes in the upper and lower extremities are brisk bilaterally.   Toes:    The toes are downgoing bilaterally.   Clonus:    Clonus is absent.    Assessment/Plan:   43 y.o. male here as requested from the emergency room for seizures.  After reviewing the chart there seems to be history of alcohol abuse and he is a daily alcohol user, he drinks usually a sixpack plus  assorted liquor daily.  He is a current every day smoker.  He was seen in the emergency room June 18, 2018 with a seizure with suspected alcohol etiology.  He was told not to drive for 6 months and to follow-up in neurology.  He was apparently also seen in the emergency room in May 2014 after "drinking too much alcohol last night". In the ED earlier this month his alcohol level was less than 10 consistent with possible withdrawal seizure.  - He drinks a 6-pack of beer and liquor a day, he also smokes, been drinking for years Discussed that going "cold [place]" or even decreasing alcohol too quickly can cause seizures but also withdrawal and death. He needs a pcp to help him with rehab and safe alcohol cessation.   - EEG and MRI of the brain, Will hold off on AEDs, one seizure and may have been etoh withdrawal.  - He does not have a primary care physician, highly recommend making an appointment to discuss alcohol cessation before doing anything  - Patient has elevated LFTs, anemia and other abnormalities on bloodwork, needs primary care will check b12, folate, vitamin b1 today  - Needs regularly schedule appointments with pcp for standard care, discussed. F/u with pcp in 4-6 weeks.   - Patient is unable to drive, operate heavy machinery, perform activities at heights or participate in water activities until 6-12 months seizure free. Discussed seizure precautions.   - educated on alcohol withdrawal syndrome, can be deadly, call 911 for any symptoms as discussed and on AVS  - f/u 3 months with [place]   Orders Placed This Encounter  Procedures  . MR BRAIN W WO CONTRAST  . B12 and Folate Panel  . Methylmalonic acid, serum  .  Vitamin B1  . EEG    Cc:   Patient, No Pcp Per  Naomie DeanAntonia Tvisha Schwoerer, MD  [facility] [address] [place], [place] [id number]  Phone [phone] Fax [phone]

## 2018-06-24 ENCOUNTER — Encounter: Payer: Self-pay | Admitting: Neurology

## 2018-06-24 ENCOUNTER — Ambulatory Visit: Payer: 59 | Admitting: Neurology

## 2018-06-24 ENCOUNTER — Telehealth: Payer: Self-pay | Admitting: Neurology

## 2018-06-24 VITALS — BP 126/82 | HR 81 | Ht 72.0 in | Wt 186.0 lb

## 2018-06-24 DIAGNOSIS — F1010 Alcohol abuse, uncomplicated: Secondary | ICD-10-CM | POA: Diagnosis not present

## 2018-06-24 DIAGNOSIS — G9340 Encephalopathy, unspecified: Secondary | ICD-10-CM

## 2018-06-24 DIAGNOSIS — R569 Unspecified convulsions: Secondary | ICD-10-CM | POA: Diagnosis not present

## 2018-06-24 DIAGNOSIS — E538 Deficiency of other specified B group vitamins: Secondary | ICD-10-CM

## 2018-06-24 DIAGNOSIS — Z720 Tobacco use: Secondary | ICD-10-CM

## 2018-06-24 NOTE — Patient Instructions (Addendum)
Blood work today MRI brain F/u 3 months Need a primary care EEG Patient is unable to drive, operate heavy machinery, perform activities at heights or participate in water activities until 6-12 months seizure free  Alcohol Withdrawal Syndrome When a person who drinks a lot of alcohol stops drinking, he or she may have unpleasant and serious symptoms. These symptoms are called alcohol withdrawal syndrome. This condition may be mild or severe. It can be life-threatening. It can cause:  Shaking that you cannot control (tremor).  Sweating.  Headache.  Feeling fearful, upset, grouchy, or depressed.  Trouble sleeping (insomnia).  Nightmares.  Fast or uneven heartbeats (palpitations).  Alcohol cravings.  Feeling sick to your stomach (nausea).  Throwing up (vomiting).  Being bothered by light and sounds.  Confusion.  Trouble thinking clearly.  Not being hungry (loss of appetite).  Big changes in mood (mood swings). If you have all of the following symptoms at the same time, get help right away:  High blood pressure.  Fast heartbeat.  Trouble breathing.  Seizures.  Seeing, hearing, feeling, smelling, or tasting things that are not there (hallucinations). These symptoms are known as delirium tremens (DTs). They must be treated at the hospital right away. Follow these instructions at home:   Take over-the-counter and prescription medicines only as told by your doctor. This includes vitamins.  Do not drink alcohol.  Do not drive until your doctor says that this is safe for you.  Have someone stay with you or be available in case you need help. This should be someone you trust. This person can help you with your symptoms. He or she can also help you to not drink.  Drink enough fluid to keep your pee (urine) pale yellow.  Think about joining a support group or a treatment program to help you stop drinking.  Keep all follow-up visits as told by your doctor. This is  important. Contact a doctor if:  Your symptoms get worse.  You cannot eat or drink without throwing up.  You have a hard time not drinking alcohol.  You cannot stop drinking alcohol. Get help right away if:  You have fast or uneven heartbeats.  You have chest pain.  You have trouble breathing.  You have a seizure for the first time.  You see, hear, feel, smell, or taste something that is not there.  You get very confused. Summary  When a person who drinks a lot of alcohol stops drinking, he or she may have serious symptoms. This is called alcohol withdrawal syndrome.  Delirium tremens (DTs) is a group of life-threatening symptoms. You should get help right away if you have these symptoms.  Think about joining an alcohol support group or a treatment program. This information is not intended to replace advice given to you by your health care provider. Make sure you discuss any questions you have with your health care provider. Document Released: 09/19/2007 Document Revised: 12/07/2016 Document Reviewed: 12/07/2016 Elsevier Interactive Patient Education  2019 [employer].    Seizure, Adult When you have a seizure:  Parts of your body may move.  You may have a change in how aware or awake (conscious) you are.  You may shake (convulse). Seizures usually last from 30 seconds to 2 minutes. Usually, they are not harmful unless they last a long time. What are the signs or symptoms? Common symptoms of this condition include:  Shaking (convulsions).  Stiffness in the body.  Passing out (losing consciousness).  Uncontrolled movements in  the: ? Arms or legs. ? Eyes. ? Head. ? Mouth. Some people have symptoms right before a seizure happens. These symptoms may include:  Fear.  Worry (anxiety).  Feeling like you are going to throw up (nausea).  Feeling like the room is spinning (vertigo).  Feeling like you saw or heard something before (dj vu).  Odd tastes  or smells.  Changes in vision, such as seeing flashing lights or spots. Follow these instructions at home: Medicines   Take over-the-counter and prescription medicines only as told by your doctor.  Do not eat or drink anything that may keep your medicine from working, such as alcohol. Activity  Do not do any activities that would be dangerous if you had another seizure, like driving or swimming. Wait until your doctor says it is safe for you to do them.  If you live in the [place] ask your local DMV (department of motor vehicles) when you can drive.  Get plenty of rest. Teaching others   Teach friends and family what to do when you have a seizure. They should: ? Lay you on the ground. ? Protect your head and body. ? Loosen any tight clothing around your neck. ? Turn you on your side. ? Not hold you down. ? Not put anything into your mouth. ? Know whether or not you need emergency care. ? Stay with you until you are better. General instructions  Contact your doctor each time you have a seizure.  Avoid anything that gives you seizures.  Keep a seizure diary. Write down: ? What you think caused each seizure. ? What you remember about each seizure.  Keep all follow-up visits as told by your doctor. This is important. Contact a doctor if:  You have another seizure.  You have seizures more often.  There is any change in what happens during your seizures.  You keep having seizures with treatment.  You have symptoms of being sick or having an infection. Get help right away if:  You have a seizure: ? That lasts longer than 5 minutes. ? That is different than seizures you had before. ? That makes it harder to breathe. ? After you hurt your head.  After a seizure, you cannot speak or use a part of your body.  After a seizure, you are confused or have a bad headache.  You have two or more seizures in a row.  You are having seizures more often.  You do not wake up  right after a seizure.  You get hurt during a seizure. In an emergency:  These symptoms may be an emergency. Do not wait to see if the symptoms will go away. Get medical help right away. Call your local emergency services (911 in the [place] Do not drive yourself to the hospital. Summary  Seizures usually last from 30 seconds to 2 minutes. Usually, they are not harmful unless they last a long time.  Do not eat or drink anything that may keep your medicine from working, such as alcohol.  Teach friends and family what to do when you have a seizure.  Contact your doctor each time you have a seizure. This information is not intended to replace advice given to you by your health care provider. Make sure you discuss any questions you have with your health care provider. Document Released: 09/19/2007 Document Revised: 12/25/2017 Document Reviewed: 05/09/2017 Elsevier Interactive Patient Education  2019 [employer].

## 2018-06-24 NOTE — Telephone Encounter (Signed)
[employer] order sent to [facility]. They will obtain the auth and reach out to the pt to schedule.  °

## 2018-06-27 LAB — METHYLMALONIC ACID, SERUM: Methylmalonic Acid: 70 nmol/L (ref 0–378)

## 2018-06-27 LAB — B12 AND FOLATE PANEL
Folate: 9.6 ng/mL (ref >3.0)
VITAMIN B 12: 401 pg/mL (ref 232–1245)

## 2018-06-27 LAB — VITAMIN B1: Thiamine: 84.6 nmol/L (ref 66.5–200.0)

## 2018-06-30 ENCOUNTER — Other Ambulatory Visit: Payer: Self-pay

## 2018-06-30 ENCOUNTER — Ambulatory Visit: Payer: 59 | Admitting: Neurology

## 2018-06-30 ENCOUNTER — Telehealth: Payer: Self-pay | Admitting: *Deleted

## 2018-06-30 DIAGNOSIS — R569 Unspecified convulsions: Secondary | ICD-10-CM

## 2018-06-30 DIAGNOSIS — G9340 Encephalopathy, unspecified: Secondary | ICD-10-CM

## 2018-06-30 NOTE — Telephone Encounter (Signed)
I called and and spoke with patient. Informed him that his labs are normal. His questions were answered. Pt had copay questions for his EEG and stated he had FMLA forms to be completed. Pt aware of 10-14 day processing and $50 fee for forms. Questions were answered. He verbalized appreciation.

## 2018-06-30 NOTE — Telephone Encounter (Signed)
-----   Message from Levert Feinstein, MD sent at 06/30/2018 10:19 AM EDT ----- Please call patient for normal laboratory result

## 2018-07-01 DIAGNOSIS — Z0289 Encounter for other administrative examinations: Secondary | ICD-10-CM

## 2018-07-01 NOTE — Procedures (Signed)
    History:   Benjamin Larson is a 43 year old gentleman with a history of alcohol abuse with onset of a seizure on 18 June 2018.  The patient did bite his tongue, the episode lasted about 2 minutes.  The patient is being evaluated for this event.  This is a routine EEG.  No skull defects noted.  Medications include Cipro and Flagyl.  EEG classification: Normal awake and drowsy  Description of the recording: The background rhythms of this recording consists of a fairly well modulated medium amplitude alpha rhythm of 9 Hz that is reactive to eye opening and closure. As the record progresses, the patient appears to remain in the waking state throughout the recording. Photic stimulation was performed, resulting in a bilateral and symmetric photic driving response. Hyperventilation was also performed, resulting in a minimal buildup of the background rhythm activities without significant slowing seen. Toward the end of the recording, the patient enters the drowsy state with slight symmetric slowing seen. The patient never enters stage II sleep. At no time during the recording does there appear to be evidence of spike or spike wave discharges or evidence of focal slowing. EKG monitor shows no evidence of cardiac rhythm abnormalities with a heart rate of 78.  Impression: This is a normal EEG recording in the waking and drowsy state. No evidence of ictal or interictal discharges are seen.

## 2018-07-02 ENCOUNTER — Telehealth: Payer: Self-pay | Admitting: *Deleted

## 2018-07-02 NOTE — Telephone Encounter (Signed)
I called pt and relayed that his EEG normal.  (does not rule out seizures).  Dr. Lucia Gaskins recommended prolonged EEG (72hour) if pt amenable.  He would think about this.  Mentioned also seeing pcp about ETOH cessation.  He verbalized understanding.  He did ask about FMLA p/w.  I would forward to [place], RN as she may have p/w.

## 2018-07-02 NOTE — Telephone Encounter (Signed)
[name], let patient know FMLA should be filled out by pcp, thanks.

## 2018-07-02 NOTE — Telephone Encounter (Signed)
-----   Message from Anson Fret, MD sent at 07/01/2018  3:04 PM EDT ----- EEG normal. This does not rule out seizures. We can order an extended EEG for him if he agrees. He needs to see pcp as we discussed. thanks

## 2018-07-03 NOTE — Telephone Encounter (Signed)
FMLA completed sent for review and signature.

## 2018-07-03 NOTE — Telephone Encounter (Signed)
Retrieved FMLA from [employer]. Per Dr. Lucia Gaskins ok to fill out.

## 2018-07-07 NOTE — Telephone Encounter (Signed)
FMLA signed by Dr. Lucia Gaskins and sent to medical records for processing.

## 2018-07-08 ENCOUNTER — Telehealth: Payer: Self-pay | Admitting: *Deleted

## 2018-07-08 NOTE — Telephone Encounter (Signed)
I called pt unable to leave message. Pt fmla form mailed to [employer] consultant.

## 2018-07-08 NOTE — Telephone Encounter (Signed)
error 

## 2018-09-02 ENCOUNTER — Ambulatory Visit: Payer: 59 | Admitting: Neurology

## 2018-09-16 ENCOUNTER — Encounter: Payer: 59 | Admitting: Family Medicine

## 2018-09-16 ENCOUNTER — Other Ambulatory Visit: Payer: Self-pay

## 2018-09-16 NOTE — Progress Notes (Signed)
Patient canceled visit

## 2018-09-23 ENCOUNTER — Encounter: Payer: Self-pay | Admitting: Family Medicine

## 2018-09-23 ENCOUNTER — Other Ambulatory Visit: Payer: Self-pay

## 2018-09-23 ENCOUNTER — Ambulatory Visit (INDEPENDENT_AMBULATORY_CARE_PROVIDER_SITE_OTHER): Payer: 59 | Admitting: Family Medicine

## 2018-09-23 DIAGNOSIS — F1010 Alcohol abuse, uncomplicated: Secondary | ICD-10-CM | POA: Diagnosis not present

## 2018-09-23 DIAGNOSIS — R569 Unspecified convulsions: Secondary | ICD-10-CM

## 2018-09-23 NOTE — Progress Notes (Addendum)
PATIENT: Benjamin Larson DOB: [birth year]  REASON FOR VISIT: follow up HISTORY FROM: patient  Addendum 09/26/2018 Dr. Jaynee Eagles:  Doesn't appear he is following our medical advise for a 72 hour eeg and has not completed his MRI of the brain. Unless he does all of these things I will not release him to drive. Also since he is not following medical advise, we should consider sending in a form to the [facility] stating he had a seizure and is not allowed to drive.   Virtual Visit via Telephone Note  I connected with Benjamin Larson on 09/23/18 at  1:00 PM EDT by telephone and verified that I am speaking with the correct person using two identifiers.   I discussed the limitations, risks, security and privacy concerns of performing an evaluation and management service by telephone and the availability of in person appointments. I also discussed with the patient that there may be a patient responsible charge related to this service. The patient expressed understanding and agreed to proceed.   History of Present Illness:  09/23/18 Benjamin Larson is a 43 y.o. 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  Benjamin Larson reports that he is trying to wean himself off of alcohol.  He states that he is drinking 2 Heineken beers per day.  He also drinks liquor but only drinks liquor on the weekends.  He does not quantify how much.  He denies any seizure activity since last being seen.  He is not on any antiepileptic medication.  He is not interested in rehab referral at this time.  He wishes to wean alcohol on his own.  EEG in our office was normal in March.  He was advised to consider a 72-hour EEG but states since COVID he has not had this performed.  History (copied from Dr Cathren Laine note on 06/24/2018)  HPI:  Benjamin Larson is a 43 y.o. male here as requested from the emergency room for seizures.  After reviewing the chart there seems to be history of alcohol abuse and he is a daily alcohol user,  he drinks usually a sixpack plus assorted liquor daily.  He is a current every day smoker.  He was seen in the emergency room June 18, 2018 with a seizure with suspected alcohol etiology.  He was told not to drive for 6 months and to follow-up in neurology.  He was apparently also seen in the emergency room in May 2014 after "drinking too much alcohol last night".  In the ED earlier this month his alcohol level was less than 10 consistent with possible withdrawal seizure. He had been drinking the night before and he was at work when he had the seizures. He drinks a 6-pack of beer and liquor a day, he also smokes, been drinking for years Discussed that going "cold [place]" or even decreasing alcohol too quickly can cause seizures but also withdrawal and death. Never had a seizure before. He doesn't remember anything, just woke up and EMS was there. Bit his tongue Fredrich Birks said the seizure lasted 2 minutes, confused afterwards, no urination, no injuries, he was foaming at the mouth. Father also had a seizure at one time when he tried to stop drinking alcohol "cold [place]". Never had a previous seizure and none since. No residual deficits. He had not eaten that day and felt lightheaded. No other focal neurologic deficits, associated symptoms, inciting events or modifiable factors.  Reviewed notes, labs and imaging  from outside physicians, which showed:   Urine rapid drug screen was negative, ethanol less than 10 consistent with withdrawal. Reviewed notes from the emergency room.  On June 18, 2018 patient was brought by EMS from work for evaluation of seizure activity.  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  While he was there he apparently fell to the floor and began having convulsions.  The patient was somewhat confused afterwards.  He denied any seizure history.  He does admit to daily alcohol consumption.  Bite marks were seen to the right side of his  tongue.  In the emergency room he was not altered however anymore.  Head CT was negative and laboratory studies were unremarkable.  The emergency room suspected alcohol as a etiology for his seizure.  No driving for 6 months.  CT head 06/18/2018:t showed No acute intracranial abnormalities including mass lesion or mass effect, hydrocephalus, extra-axial fluid collection, midline shift, hemorrhage, or acute infarction, large ischemic events (personally reviewed images)  Elevated AST and ALT BUN 10, Creat 1.04 Anemic 11.2/35.2   Observations/Objective:  Generalized: Well developed, in no acute distress  Mentation: Alert oriented to time, place, history taking. Follows all commands speech and language fluent   Assessment and Plan:  43 y.o. year old male  has no past medical history on file. here with    ICD-10-CM   1. Seizure (HCC) R56.9   2. Alcohol abuse F10.10    Benjamin Larson continues to use alcohol on a daily basis.  He reports at least 2 Heineken beers each day.  He usually drinks liquor on the weekend.  He is not interested in a rehab referral at this time.  He was advised should he change his mind to please reach out and we will help get him in with an alcohol cessation program.  I have advised that he continue to wean his alcohol use.  Goal is to avoid daily use of alcohol.  He is not driving at this time and has been advised not to do so.  I have scheduled a 1180-month follow-up.  If he remains seizure-free we will consider resuming driving privileges.  He was advised to call our office should any seizure-like activity occur.  He verbalizes understanding and agreement with this plan.  No orders of the defined types were placed in this encounter.   No orders of the defined types were placed in this encounter.    Follow Up Instructions:  I discussed the assessment and treatment plan with the patient. The patient was provided an opportunity to ask questions and all were answered. The  patient agreed with the plan and demonstrated an understanding of the instructions.   The patient was advised to call back or seek an in-person evaluation if the symptoms worsen or if the condition fails to improve as anticipated.  I provided 20 minutes of non-face-to-face time during this encounter.  Patient is located at his place of residence during video conference.  Provider is located in the office.  Rozell SearingBrittany Duff, CMA helped to facilitate visit.  Addendum 09/26/2018 Dr. Lucia GaskinsAhern:  Doesn't appear he is following our medical advise for a 72 hour eeg and has not completed his MRI of the brain. Unless he does all of these things I will not release him to drive. Also since he is not following medical advise, we should [facility] he [facility]  a seizure and is not allowed to drive.   Shawnie DapperAmy Kashara Blocher, NP

## 2018-09-26 NOTE — Progress Notes (Signed)
Made any corrections needed, and agree with history, physical, neuro exam,assessment and plan as stated.     Roland Lipke, MD [facility]  

## 2018-09-30 NOTE — Progress Notes (Signed)
I have attempted to contact patient at number provided on chart. No answer and VM is full. Will continue to call.

## 2018-10-08 ENCOUNTER — Encounter: Payer: Self-pay | Admitting: Family Medicine

## 2018-10-08 ENCOUNTER — Telehealth: Payer: Self-pay | Admitting: Family Medicine

## 2018-10-08 ENCOUNTER — Telehealth: Payer: Self-pay

## 2018-10-08 NOTE — Telephone Encounter (Signed)
I have again attempted to contact patient via telephone numbers provided in chart regarding recent visit and concerns expressed by Dr. Lavell Anchors and myself.  A letter will be written and out to patient today.

## 2018-10-08 NOTE — Telephone Encounter (Signed)
[facility] form has been faxed to [phone]. Confirmation fax has been received.

## 2018-10-22 ENCOUNTER — Telehealth: Payer: Self-pay | Admitting: Family Medicine

## 2018-10-22 NOTE — Telephone Encounter (Signed)
Fax confirmation received [facility] [phone], [phone].

## 2018-10-22 NOTE — Telephone Encounter (Signed)
I called pt and he relayed that he received letter from [place].  He states that he has not been driving since his seizure.  Understands that he cannot drive for 6 months from seizure.  He is ok to proceed with VEEG 72 hour, and MRI. Order to [facility] to AL/NP for signature.

## 2018-10-22 NOTE — Telephone Encounter (Signed)
Noted, yes I will.

## 2018-10-22 NOTE — Telephone Encounter (Signed)
Pt states he received a letter stating that he should not be driving and he does not understand why he is receiving this letter. Pt would like RN to call him back to discuss.

## 2018-10-23 NOTE — Telephone Encounter (Signed)
Received fax that [facility] did receive order for 72Hour VEEG on pt and will let us know when pt scheduled.  Interpreting MD Glade Stanford.

## 2018-10-24 ENCOUNTER — Ambulatory Visit: Payer: 59 | Admitting: Family Medicine

## 2018-10-27 NOTE — Telephone Encounter (Signed)
Received confirmation that VEEG is scheduled 11-10-18 from [facility].

## 2018-11-15 ENCOUNTER — Inpatient Hospital Stay: Admission: RE | Admit: 2018-11-15 | Payer: 59 | Source: Ambulatory Visit

## 2018-12-17 NOTE — Progress Notes (Deleted)
PATIENT: Benjamin Larson DOB: 1976/01/21  REASON FOR VISIT: follow up HISTORY FROM: patient  No chief complaint on file.    HISTORY OF PRESENT ILLNESS: Today 12/17/18 Benjamin Larson is a 43 y.o. 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    HISTORY: (copied from  note on 09/23/2018)  Benjamin Larson is a 43 y.o. 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  Benjamin Larson reports that he is trying to wean himself off of alcohol.  He states that he is drinking 2 Heineken beers per day.  He also drinks liquor but only drinks liquor on the weekends.  He does not quantify how much.  He denies any seizure activity since last being seen.  He is not on any antiepileptic medication.  He is not interested in rehab referral at this time.  He wishes to wean alcohol on his own.  EEG in our office was normal in March.  He was advised to consider a 72-hour EEG but states since COVID he has not had this performed.  History (copied from Dr Trevor MaceAhern's note on 06/24/2018)  ZOX:[id number]:Benjamin W Mooreis a 42 y.o.malehere as requested from theemergency roomfor seizures. After reviewing the chart there seems to be history of alcohol abuse and he is a daily alcohol user, he drinks usually a sixpack plus assorted liquor daily. He is a current every day smoker. He was seen in the emergency room June 18, 2018 with a seizure with suspected alcohol etiology. He was told not to drive for 6 months and to follow-up in neurology. He was apparently also seen in the emergency room in May 2014 after "drinking too much alcohol last night". In the ED earlier this month his alcohol level was less than 10 consistent with possible withdrawal seizure.He had been drinking the night before and he was at work when he had the seizures. He drinks a 6-pack of beer and liquor a day, he also smokes, been drinking for years Discussed that going "cold [place]" or even decreasing alcohol too  quickly can cause seizures but also withdrawal and death. Never had a seizure before. He doesn't remember anything, just woke up and EMS was there. Bit his tongue Louann SjogrenBoss said the seizure lasted 2 minutes, confused afterwards, no urination, no injuries, he was foaming at the mouth. Father also had a seizure at one time when he tried to stop drinking alcohol "cold [place]". Never had a previous seizure and none since. No residual deficits. He had not eaten that day and felt lightheaded.No other focal neurologic deficits, associated symptoms, inciting events or modifiable factors.  Reviewed notes, labs and imaging from outside physicians, which showed:   Urine rapid drug screen was negative, ethanol less than 10 consistent with withdrawal. Reviewed notes from the emergency room. On June 18, 2018 patient was brought by EMS from work for evaluation of seizure activity. 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 While he was there he apparently fell to the floor and began having convulsions. The patient was somewhat confused afterwards. He denied any seizure history. He does admit to daily alcohol consumption. Bite marks were seen to the right side of his tongue. In the emergency room he was not altered however anymore. Head CT was negative and laboratory studies were unremarkable. The emergency room suspected alcohol as a etiology for his seizure. No  driving for 6 months.  CT head 06/18/2018:t showed No acute intracranial abnormalities including mass lesion or mass effect, hydrocephalus, extra-axial fluid collection, midline shift, hemorrhage, or acute infarction, large ischemic events (personally reviewed images)  Elevated AST and ALT BUN 10, Creat 1.04 Anemic 11.2/35.2  REVIEW OF SYSTEMS: Out of a complete 14 system review of symptoms, the patient complains only of the following symptoms, and all other reviewed systems are  negative.  ALLERGIES: No Known Allergies  HOME MEDICATIONS: Outpatient Medications Prior to Visit  Medication Sig Dispense Refill   ciprofloxacin (CIPRO) 500 MG tablet Take 1 tablet (500 mg total) by mouth 2 (two) times daily. (Patient not taking: Reported on 06/18/2018) 28 tablet 0   metroNIDAZOLE (FLAGYL) 500 MG tablet Take 1 tablet (500 mg total) by mouth 2 (two) times daily. (Patient not taking: Reported on 06/18/2018) 28 tablet 0   No facility-administered medications prior to visit.     PAST MEDICAL HISTORY: No past medical history on file.  PAST SURGICAL HISTORY: Past Surgical History:  Procedure Laterality Date   ACHILLES TENDON REPAIR Right    HERNIA REPAIR  2006   groin   PATELLAR TENDON REPAIR Right 2000    FAMILY HISTORY: Family History  Problem Relation Age of Onset   Heart attack Mother    Stroke Mother    Diabetes Mother        pt thinks she has diabetes   Seizures Neg Hx     SOCIAL HISTORY: Social History   Socioeconomic History   Marital status: Married    Spouse name: Not on file   Number of children: 2   Years of education: Not on file   Highest education level: High school graduate  Occupational History   Not on file  Social Needs   Financial resource strain: Not on file   Food insecurity    Worry: Not on file    Inability: Not on file   Transportation needs    Medical: Not on file    Non-medical: Not on file  Tobacco Use   Smoking status: Current Every Day Smoker    Packs/day: 0.50    Types: Cigarettes   Smokeless tobacco: Never Used  Substance and Sexual Activity   Alcohol use: Yes    Alcohol/week: 28.0 - 35.0 standard drinks    Types: 28 - 35 Standard drinks or equivalent per week    Comment: daily 4-5 drinks    Drug use: No   Sexual activity: Not on file  Lifestyle   Physical activity    Days per week: Not on file    Minutes per session: Not on file   Stress: Not on file  Relationships   Social  connections    Talks on phone: Not on file    Gets together: Not on file    Attends religious service: Not on file    Active member of club or organization: Not on file    Attends meetings of clubs or organizations: Not on file    Relationship status: Not on file   Intimate partner violence    Fear of current or ex partner: Not on file    Emotionally abused: Not on file    Physically abused: Not on file    Forced sexual activity: Not on file  Other Topics Concern   Not on file  Social History Narrative   Lives at home with his wife   Left handed   Caffeine: about 1 cup of coffee  daily, tries to stay away from sodas      PHYSICAL EXAM  There were no vitals filed for this visit. There is no height or weight on file to calculate BMI.  Generalized: Well developed, in no acute distress  Cardiology: normal rate and rhythm, no murmur noted Neurological examination  Mentation: Alert oriented to time, place, history taking. Follows all commands speech and language fluent Cranial nerve II-XII: Pupils were equal round reactive to light. Extraocular movements were full, visual field were full on confrontational test. Facial sensation and strength were normal. Uvula tongue midline. Head turning and shoulder shrug  were normal and symmetric. Motor: The motor testing reveals 5 over 5 strength of all 4 extremities. Good symmetric motor tone is noted throughout.  Sensory: Sensory testing is intact to soft touch on all 4 extremities. No evidence of extinction is noted.  Coordination: Cerebellar testing reveals good finger-nose-finger and heel-to-shin bilaterally.  Gait and station: Gait is normal. Tandem gait is normal. Romberg is negative. No drift is seen.  Reflexes: Deep tendon reflexes are symmetric and normal bilaterally.   DIAGNOSTIC DATA (LABS, IMAGING, TESTING) - I reviewed patient records, labs, notes, testing and imaging myself where available.  No flowsheet data found.   Lab  Results  Component Value Date   WBC 4.2 06/18/2018   HGB 11.2 (L) 06/18/2018   HCT 35.2 (L) 06/18/2018   MCV 89.8 06/18/2018   PLT 58 (L) 06/18/2018      Component Value Date/Time   NA 135 06/18/2018 1835   K 3.9 06/18/2018 1835   CL 99 06/18/2018 1835   CO2 24 06/18/2018 1835   GLUCOSE 107 (H) 06/18/2018 1835   BUN 10 06/18/2018 1835   CREATININE 1.04 06/18/2018 1835   CALCIUM 9.9 06/18/2018 1835   PROT 7.3 06/18/2018 1835   ALBUMIN 4.2 06/18/2018 1835   AST 133 (H) 06/18/2018 1835   ALT 83 (H) 06/18/2018 1835   ALKPHOS 57 06/18/2018 1835   BILITOT 1.6 (H) 06/18/2018 1835   GFRNONAA >60 06/18/2018 1835   GFRAA >60 06/18/2018 1835   No results found for: CHOL, HDL, LDLCALC, LDLDIRECT, TRIG, CHOLHDL No results found for: NTZG0F Lab Results  Component Value Date   VITAMINB12 401 06/24/2018   No results found for: TSH     ASSESSMENT AND PLAN 43 y.o. year old male  has no past medical history on file. here with ***    ICD-10-CM   1. Seizure (HCC)  R56.9        No orders of the defined types were placed in this encounter.    No orders of the defined types were placed in this encounter.     I spent 15 minutes with the patient. 50% of this time was spent counseling and educating patient on plan of care and medications.    Shawnie Dapper, FNP-C 12/17/2018, 1:21 PM [facility] [address] [place], [place] [address] [phone]

## 2018-12-18 ENCOUNTER — Telehealth: Payer: Self-pay

## 2018-12-18 ENCOUNTER — Ambulatory Visit: Payer: 59 | Admitting: Family Medicine

## 2018-12-18 NOTE — Telephone Encounter (Signed)
Patient was a no call/no show for their appointment today.   

## 2019-05-01 ENCOUNTER — Emergency Department (HOSPITAL_COMMUNITY)
Admission: EM | Admit: 2019-05-01 | Discharge: 2019-05-02 | Disposition: A | Discharge status: Home / Self Care | Payer: PRIVATE HEALTH INSURANCE | Attending: Emergency Medicine | Admitting: Emergency Medicine

## 2019-05-01 ENCOUNTER — Encounter (HOSPITAL_COMMUNITY): Payer: Self-pay | Admitting: Emergency Medicine

## 2019-05-01 ENCOUNTER — Other Ambulatory Visit: Payer: Self-pay

## 2019-05-01 DIAGNOSIS — F10230 Alcohol dependence with withdrawal, uncomplicated: Secondary | ICD-10-CM | POA: Diagnosis not present

## 2019-05-01 DIAGNOSIS — R4182 Altered mental status, unspecified: Secondary | ICD-10-CM | POA: Diagnosis present

## 2019-05-01 DIAGNOSIS — R197 Diarrhea, unspecified: Secondary | ICD-10-CM | POA: Diagnosis not present

## 2019-05-01 DIAGNOSIS — R569 Unspecified convulsions: Secondary | ICD-10-CM | POA: Insufficient documentation

## 2019-05-01 DIAGNOSIS — F17210 Nicotine dependence, cigarettes, uncomplicated: Secondary | ICD-10-CM | POA: Diagnosis not present

## 2019-05-01 DIAGNOSIS — Y92002 Bathroom of unspecified non-institutional (private) residence single-family (private) house as the place of occurrence of the external cause: Secondary | ICD-10-CM | POA: Diagnosis not present

## 2019-05-01 DIAGNOSIS — Y999 Unspecified external cause status: Secondary | ICD-10-CM | POA: Insufficient documentation

## 2019-05-01 DIAGNOSIS — R600 Localized edema: Secondary | ICD-10-CM | POA: Diagnosis not present

## 2019-05-01 DIAGNOSIS — W19XXXA Unspecified fall, initial encounter: Secondary | ICD-10-CM | POA: Diagnosis not present

## 2019-05-01 DIAGNOSIS — Y9389 Activity, other specified: Secondary | ICD-10-CM | POA: Insufficient documentation

## 2019-05-01 DIAGNOSIS — F10930 Alcohol use, unspecified with withdrawal, uncomplicated: Secondary | ICD-10-CM

## 2019-05-01 LAB — CBG MONITORING, ED: Glucose-Capillary: 115 mg/dL — ABNORMAL HIGH (ref 70–99)

## 2019-05-01 MED ORDER — SODIUM CHLORIDE 0.9 % IV BOLUS
1000.0000 mL | Freq: Once | INTRAVENOUS | Status: AC
  Administered 2019-05-02: 1000 mL via INTRAVENOUS

## 2019-05-01 MED ORDER — SODIUM CHLORIDE 0.9 % IV SOLN: INTRAVENOUS | Status: DC

## 2019-05-01 MED ORDER — SODIUM CHLORIDE 0.9 % IV BOLUS
1000.0000 mL | Freq: Once | INTRAVENOUS | Status: AC
  Administered 2019-05-01: 1000 mL via INTRAVENOUS

## 2019-05-01 NOTE — ED Notes (Signed)
ED Provider at bedside. 

## 2019-05-01 NOTE — ED Triage Notes (Addendum)
Pt presents to ED from home BIB [facility]. Pt c/o unwitnessed fall. Wife found pt unresponsive. Per EMS pt postictal on arrival. Pt reports no memory of the event AAOx4. Pt last drink on Monday. Reports n/v/d today. Pt has slight edema above R eye. EMS VSS.

## 2019-05-02 LAB — COMPREHENSIVE METABOLIC PANEL
ALT: 85 U/L — ABNORMAL HIGH (ref 0–44)
AST: 79 U/L — ABNORMAL HIGH (ref 15–41)
Albumin: 4.1 g/dL (ref 3.5–5.0)
Alkaline Phosphatase: 62 U/L (ref 38–126)
Anion gap: 12 (ref 5–15)
BUN: 11 mg/dL (ref 6–20)
CO2: 25 mmol/L (ref 22–32)
Calcium: 9.7 mg/dL (ref 8.9–10.3)
Chloride: 100 mmol/L (ref 98–111)
Creatinine, Ser: 1 mg/dL (ref 0.61–1.24)
GFR calc Af Amer: >60 mL/min (ref >60)
GFR calc non Af Amer: >60 mL/min (ref >60)
Glucose, Bld: 123 mg/dL — ABNORMAL HIGH (ref 70–99)
Potassium: 3.7 mmol/L (ref 3.5–5.1)
Sodium: 137 mmol/L (ref 135–145)
Total Bilirubin: 1.9 mg/dL — ABNORMAL HIGH (ref 0.3–1.2)
Total Protein: 6.9 g/dL (ref 6.5–8.1)

## 2019-05-02 LAB — CBC WITH DIFFERENTIAL/PLATELET
Abs Immature Granulocytes: 0.02 10*3/uL (ref 0.00–0.07)
Basophils Absolute: 0 10*3/uL (ref 0.0–0.1)
Basophils Relative: 1 %
Eosinophils Absolute: 0 10*3/uL (ref 0.0–0.5)
Eosinophils Relative: 1 %
HCT: 37.2 % — ABNORMAL LOW (ref 39.0–52.0)
Hemoglobin: 12.1 g/dL — ABNORMAL LOW (ref 13.0–17.0)
Immature Granulocytes: 1 %
Lymphocytes Relative: 13 %
Lymphs Abs: 0.5 10*3/uL — ABNORMAL LOW (ref 0.7–4.0)
MCH: 31.8 pg (ref 26.0–34.0)
MCHC: 32.5 g/dL (ref 30.0–36.0)
MCV: 97.9 fL (ref 80.0–100.0)
Monocytes Absolute: 0.4 10*3/uL (ref 0.1–1.0)
Monocytes Relative: 10 %
Neutro Abs: 3.2 10*3/uL (ref 1.7–7.7)
Neutrophils Relative %: 74 %
Platelets: 66 10*3/uL — ABNORMAL LOW (ref 150–400)
RBC: 3.8 MIL/uL — ABNORMAL LOW (ref 4.22–5.81)
RDW: 16.6 % — ABNORMAL HIGH (ref 11.5–15.5)
WBC: 4.2 10*3/uL (ref 4.0–10.5)
nRBC: 0 % (ref 0.0–0.2)

## 2019-05-02 MED ORDER — CHLORDIAZEPOXIDE HCL 25 MG PO CAPS: ORAL_CAPSULE | ORAL | 0 refills | Status: DC

## 2019-05-02 MED ORDER — CHLORDIAZEPOXIDE HCL 25 MG PO CAPS
25.0000 mg | ORAL_CAPSULE | Freq: Once | ORAL | Status: AC
  Administered 2019-05-02: 25 mg via ORAL
  Filled 2019-05-02: qty 1

## 2019-05-02 NOTE — ED Provider Notes (Signed)
[facility] EMERGENCY DEPARTMENT Provider Note  CSN: [id number] Arrival date & time: 05/01/19 2243  Chief Complaint(s) Seizures and Fall  Pt presents to ED from home BIB [facility]. Pt c/o unwitnessed fall. Wife found pt unresponsive. Per EMS pt postictal on arrival. Pt reports no memory of the event AAOx4. Pt last drink on Monday. Reports n/v/d today. Pt has slight edema above R eye. EMS VSS.  HPI Benjamin Larson is a 44 y.o. male with a history of alcohol abuse and a single withdrawal seizure in March of this past year presents to the emergency department after a likely seizure today.  Patient reports last drink was 2 days ago.  States that he is trying to wean himself of alcohol.  2 days ago he decided to completely stop drinking.  Today the patient reports falling asleep while watching TV.  He got up to go to the bathroom and passed out.  Patient does not remember the situation surrounding but does report feeling mildly clammy and having mild tremors for the past couple days.  His significant other apparently heard him fall and found him unresponsive in the bathroom.  EMS was called and reported patient was postictal upon their arrival.  Patient endorses mild nausea, and 2 days of loose bowel movements but none today.  Denies any fevers or chills.  No recent infections.  No chest pain or shortness of breath.  No headache, neck pain, back pain, abdominal pain.  No extremity pain.  No focal deficits.  Denies any other physical complaints.  HPI  Past Medical History History reviewed. No pertinent past medical history. Patient Active Problem List   Diagnosis Date Noted  . Alcohol abuse 06/24/2018  . Seizure ([place]) 06/24/2018  . Tobacco abuse 06/24/2018   Home Medication(s) Prior to Admission medications   Medication Sig Start Date End Date Taking? Authorizing Provider  chlordiazePOXIDE (LIBRIUM) 25 MG capsule 50mg  PO TID x 1D, then 25-50mg  PO BID X 1D, then 25-50mg  PO QD X 1D  05/02/19   Earsel Shouse, Grayce Sessions, MD  ciprofloxacin (CIPRO) 500 MG tablet Take 1 tablet (500 mg total) by mouth 2 (two) times daily. Patient not taking: Reported on 06/18/2018 01/01/17   Horton, Barbette Hair, MD  metroNIDAZOLE (FLAGYL) 500 MG tablet Take 1 tablet (500 mg total) by mouth 2 (two) times daily. Patient not taking: Reported on 06/18/2018 01/01/17   Horton, Barbette Hair, MD                                                                                                                                    Past Surgical History Past Surgical History:  Procedure Laterality Date  . ACHILLES TENDON REPAIR Right   . HERNIA REPAIR  2006   groin  . PATELLAR TENDON REPAIR Right 2000   Family History Family History  Problem Relation Age of Onset  . Heart attack Mother   . Stroke  Mother   . Diabetes Mother        pt thinks she has diabetes  . Seizures Neg Hx     Social History Social History   Tobacco Use  . Smoking status: Current Every Day Smoker    Packs/day: 0.50    Types: Cigarettes  . Smokeless tobacco: Never Used  Substance Use Topics  . Alcohol use: Yes    Alcohol/week: 28.0 - 35.0 standard drinks    Types: 28 - 35 Standard drinks or equivalent per week    Comment: daily 4-5 drinks   . Drug use: No   Allergies Patient has no known allergies.  Review of Systems Review of Systems All other systems are reviewed and are negative for acute change except as noted in the HPI  Physical Exam Vital Signs  I have reviewed the triage vital signs BP (!) 151/98   Pulse 81   Temp 98.7 F (37.1 C) (Oral)   Resp 19   Ht 6' (1.829 m)   Wt 83.5 kg   SpO2 98%   BMI 24.95 kg/m   Physical Exam Vitals reviewed.  Constitutional:      General: He is not in acute distress.    Appearance: He is well-developed. He is not diaphoretic.  HENT:     Head: Normocephalic and atraumatic.     Nose: Nose normal.  Eyes:     General: No scleral icterus.       Right eye: No discharge.         Left eye: No discharge.     Conjunctiva/sclera: Conjunctivae normal.     Pupils: Pupils are equal, round, and reactive to light.  Cardiovascular:     Rate and Rhythm: Normal rate and regular rhythm.     Heart sounds: No murmur. No friction rub. No gallop.   Pulmonary:     Effort: Pulmonary effort is normal. No respiratory distress.     Breath sounds: Normal breath sounds. No stridor. No rales.  Abdominal:     General: There is no distension.     Palpations: Abdomen is soft.     Tenderness: There is no abdominal tenderness.  Musculoskeletal:        General: No tenderness.     Cervical back: Normal range of motion and neck supple.  Skin:    General: Skin is warm and dry.     Findings: No erythema or rash.  Neurological:     Mental Status: He is alert and oriented to person, place, and time.     Motor: Tremor (faint) present.     ED Results and Treatments Labs (all labs ordered are listed, but only abnormal results are displayed) Labs Reviewed  CBC WITH DIFFERENTIAL/PLATELET - Abnormal; Notable for the following components:      Result Value   RBC 3.80 (*)    Hemoglobin 12.1 (*)    HCT 37.2 (*)    RDW 16.6 (*)    Platelets 66 (*)    Lymphs Abs 0.5 (*)    All other components within normal limits  COMPREHENSIVE METABOLIC PANEL - Abnormal; Notable for the following components:   Glucose, Bld 123 (*)    AST 79 (*)    ALT 85 (*)    Total Bilirubin 1.9 (*)    All other components within normal limits  CBG MONITORING, ED - Abnormal; Notable for the following components:   Glucose-Capillary 115 (*)    All other components within normal limits  EKG  EKG Interpretation  Date/Time:  Friday May 01 2019 22:53:17 EST Ventricular Rate:  84 PR Interval:    QRS Duration: 97 QT Interval:  389 QTC Calculation: 460 R Axis:   75 Text Interpretation: Sinus  rhythm Borderline T wave abnormalities NO STEMI. No old tracing to compare Confirmed by Drema Pry [phone]) on 05/01/2019 11:14:09 PM      Radiology No results found.  Pertinent labs & imaging results that were available during my care of the patient were reviewed by me and considered in my medical decision making (see chart for details).  Medications Ordered in ED Medications  sodium chloride 0.9 % bolus 1,000 mL (0 mLs Intravenous Stopped 05/02/19 0130)    And  sodium chloride 0.9 % bolus 1,000 mL (1,000 mLs Intravenous New Bag/Given 05/02/19 0129)    And  0.9 %  sodium chloride infusion ( Intravenous New Bag/Given 05/02/19 0201)  chlordiazePOXIDE (LIBRIUM) capsule 25 mg (25 mg Oral Given 05/02/19 0025)                                                                                                                                    Procedures Procedures  (including critical care time)  Medical Decision Making / ED Course I have reviewed the nursing notes for this encounter and the patient's prior records (if available in EHR or on provided paperwork).   RAHEEL KUNKLE was evaluated in Emergency Department on 05/02/2019 for the symptoms described in the history of present illness. He was evaluated in the context of the global COVID-19 pandemic, which necessitated consideration that the patient might be at risk for infection with the SARS-CoV-2 virus that causes COVID-19. Institutional protocols and algorithms that pertain to the evaluation of patients at risk for COVID-19 are in a state of rapid change based on information released by regulatory bodies including the CDC and federal and state organizations. These policies and algorithms were followed during the patient's care in the ED.  Likely alcohol withdrawal seizure.  Screening labs grossly reassuring without electrolyte derangements or renal insufficiency. No significant head trauma noted on exam and patient denies any headache at  this time.  Low suspicion for ICH.  Patient was provided with a dose of Librium.  He is now I am able to following through with alcohol cessation.   Patient already has established care with neurologist and has had brain imaging. No need for repeat imaging here.   Patient monitored for 3 hours without recurrence of seizure.  Tremors resolved.  Feel he is appropriate for outpatient Librium taper.  The patient appears reasonably screened and/or stabilized for discharge and I doubt any other medical condition or other [facility] requiring further screening, evaluation, or treatment in the ED at this time prior to discharge.  The patient is safe for discharge with strict return precautions.        Final Clinical Impression(s) / ED Diagnoses Final  diagnoses:  Alcohol withdrawal seizure without complication (HCC)    The patient appears reasonably screened and/or stabilized for discharge and I doubt any other medical condition or other [facility] requiring further screening, evaluation, or treatment in the ED at this time prior to discharge.  Disposition: Discharge  Condition: Good  I have discussed the results, Dx and Tx plan with the patient who expressed understanding and agree(s) with the plan. Discharge instructions discussed at great length. The patient was given strict return precautions who verbalized understanding of the instructions. No further questions at time of discharge.    ED Discharge Orders         Ordered    chlordiazePOXIDE (LIBRIUM) 25 MG capsule     05/02/19 0211            Follow Up: Primary care provider  Schedule an appointment as soon as possible for a visit    Neurology        This chart was dictated using voice recognition software.  Despite best efforts to proofread,  errors can occur which can change the documentation meaning.   Nira Conn, MD 05/02/19 [phone]

## 2019-05-02 NOTE — ED Notes (Signed)
Discharge instructions discussed with pt. Pt verbalized understanding. Pt stable and ambulatory. No signature pad available. 

## 2019-05-02 NOTE — ED Notes (Signed)
Patient ambulated to the bathroom and back. No complaints of dizziness or being lightheaded. Steady gait.

## 2021-01-19 ENCOUNTER — Encounter: Payer: Self-pay | Admitting: Emergency Medicine

## 2021-01-19 ENCOUNTER — Ambulatory Visit (INDEPENDENT_AMBULATORY_CARE_PROVIDER_SITE_OTHER): Payer: BC Managed Care – PPO

## 2021-01-19 ENCOUNTER — Ambulatory Visit
Admission: EM | Admit: 2021-01-19 | Discharge: 2021-01-19 | Disposition: A | Discharge status: Home / Self Care | Payer: BC Managed Care – PPO | Attending: Urgent Care | Admitting: Urgent Care

## 2021-01-19 DIAGNOSIS — M25462 Effusion, left knee: Secondary | ICD-10-CM

## 2021-01-19 DIAGNOSIS — M1712 Unilateral primary osteoarthritis, left knee: Secondary | ICD-10-CM | POA: Diagnosis not present

## 2021-01-19 DIAGNOSIS — G8929 Other chronic pain: Secondary | ICD-10-CM

## 2021-01-19 DIAGNOSIS — M25562 Pain in left knee: Secondary | ICD-10-CM | POA: Diagnosis not present

## 2021-01-19 MED ORDER — PREDNISONE 20 MG PO TABS: ORAL_TABLET | ORAL | 0 refills | Status: DC

## 2021-01-19 NOTE — ED Triage Notes (Signed)
Patient c/o left knee pain, possible fluid on knee.  Patient has been dealing with this for over 3 months.  Denies any OTC meds.

## 2021-01-19 NOTE — ED Provider Notes (Signed)
[facility]   MRN: [id number] DOB: 1975-08-25  Subjective:   Benjamin Larson is a 45 y.o. male presenting for 54-month history of acute on chronic left knee pain with swelling.  Patient had this first identified in October 2016.  He had an effusion of the knee joint at the time.  Was seen by an orthopedist but was lost to follow-up.  It was recommended that he have an arthroscopy but this never happened. No falls, trauma, redness, history of gout.  No history of heart disease, heart failure.  No history of diabetes.  No current facility-administered medications for this encounter.  Current Outpatient Medications:    chlordiazePOXIDE (LIBRIUM) 25 MG capsule, 50mg  PO TID x 1D, then 25-50mg  PO BID X 1D, then 25-50mg  PO QD X 1D, Disp: 10 capsule, Rfl: 0   ciprofloxacin (CIPRO) 500 MG tablet, Take 1 tablet (500 mg total) by mouth 2 (two) times daily. (Patient not taking: No sig reported), Disp: 28 tablet, Rfl: 0   metroNIDAZOLE (FLAGYL) 500 MG tablet, Take 1 tablet (500 mg total) by mouth 2 (two) times daily. (Patient not taking: No sig reported), Disp: 28 tablet, Rfl: 0   No Known Allergies  History reviewed. No pertinent past medical history.   Past Surgical History:  Procedure Laterality Date   ACHILLES TENDON REPAIR Right    HERNIA REPAIR  2006   groin   PATELLAR TENDON REPAIR Right 2000    Family History  Problem Relation Age of Onset   Heart attack Mother    Stroke Mother    Diabetes Mother        pt thinks she has diabetes   Seizures Neg Hx     Social History   Tobacco Use   Smoking status: Every Day    Packs/day: 0.50    Types: Cigarettes   Smokeless tobacco: Never  Vaping Use   Vaping Use: Never used  Substance Use Topics   Alcohol use: Yes    Alcohol/week: 28.0 - 35.0 standard drinks    Types: 28 - 35 Standard drinks or equivalent per week    Comment: daily 4-5 drinks    Drug use: No    ROS   Objective:   Vitals: BP 130/85 (BP  Location: Left Arm)   Pulse 86   Temp 98 F (36.7 C) (Oral)   Ht 6' (1.829 m)   Wt 180 lb (81.6 kg)   SpO2 95%   BMI 24.41 kg/m   Physical Exam Constitutional:      General: He is not in acute distress.    Appearance: Normal appearance. He is well-developed and normal weight. He is not ill-appearing, toxic-appearing or diaphoretic.  HENT:     Head: Normocephalic and atraumatic.     Right Ear: External ear normal.     Left Ear: External ear normal.     Nose: Nose normal.     Mouth/Throat:     Pharynx: Oropharynx is clear.  Eyes:     General: No scleral icterus.       Right eye: No discharge.        Left eye: No discharge.     Extraocular Movements: Extraocular movements intact.     Conjunctiva/sclera: Conjunctivae normal.     Pupils: Pupils are equal, round, and reactive to light.  Cardiovascular:     Rate and Rhythm: Normal rate.  Pulmonary:     Effort: Pulmonary effort is normal.  Musculoskeletal:     Cervical back: Normal  range of motion.     Left knee: Swelling and effusion present. No deformity, erythema, ecchymosis, lacerations, bony tenderness or crepitus. Decreased range of motion. No tenderness. Normal alignment and normal patellar mobility.  Skin:    General: Skin is warm and dry.  Neurological:     Mental Status: He is alert and oriented to person, place, and time.     Motor: No weakness.     Coordination: Coordination normal.     Gait: Gait normal.     Deep Tendon Reflexes: Reflexes normal.  Psychiatric:        Mood and Affect: Mood normal.        Behavior: Behavior normal.        Thought Content: Thought content normal.        Judgment: Judgment normal.   DG Knee Complete 4 Views Left  Result Date: 01/19/2021 CLINICAL DATA:  Pain and swelling EXAM: LEFT KNEE - COMPLETE 4+ VIEW COMPARISON:  None. FINDINGS: No evidence of fracture or dislocation. Moderate to large joint effusion. Mild tricompartmental degenerative changes, most pronounced in the lateral  and patellofemoral compartment. 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 IMPRESSION: No acute osseous abnormality. Moderate to large left knee joint effusion. Electronically Signed   By: Allegra Lai M.D.   On: 01/19/2021 16:00    02/11/2015 IMAGING: XR: AP, lateral, and sunrise views were independently reviewed and demonstrate: Mild tricompartmental osteophytic change, but no significant joint space narrowing. On the lateral condyle, there is lucency consistent with a lateral femoral condyle osteochondritis dissecans lesion.  Assessment and Plan :   PDMP not reviewed this encounter.  1. Knee effusion, left   2. Arthritis of left knee   3. Chronic pain of left knee     Recommended an oral prednisone course. Follow up with ortho as soon as possible. Counseled patient on potential for adverse effects with medications prescribed/recommended today, ER and return-to-clinic precautions discussed, patient verbalized understanding.    Wallis Bamberg, PA-C 01/19/21 1606

## 2022-02-11 ENCOUNTER — Other Ambulatory Visit: Payer: Self-pay

## 2022-02-11 ENCOUNTER — Encounter (HOSPITAL_BASED_OUTPATIENT_CLINIC_OR_DEPARTMENT_OTHER): Payer: Self-pay

## 2022-02-11 ENCOUNTER — Emergency Department (HOSPITAL_BASED_OUTPATIENT_CLINIC_OR_DEPARTMENT_OTHER)
Admission: EM | Admit: 2022-02-11 | Discharge: 2022-02-11 | Disposition: A | Discharge status: Home / Self Care | Payer: BC Managed Care – PPO | Attending: Emergency Medicine | Admitting: Emergency Medicine

## 2022-02-11 DIAGNOSIS — K029 Dental caries, unspecified: Secondary | ICD-10-CM | POA: Diagnosis not present

## 2022-02-11 DIAGNOSIS — K047 Periapical abscess without sinus: Secondary | ICD-10-CM | POA: Insufficient documentation

## 2022-02-11 DIAGNOSIS — K0889 Other specified disorders of teeth and supporting structures: Secondary | ICD-10-CM | POA: Diagnosis present

## 2022-02-11 MED ORDER — PENICILLIN V POTASSIUM 500 MG PO TABS: 500.0000 mg | ORAL_TABLET | Freq: Four times a day (QID) | ORAL | 0 refills | Status: AC

## 2022-02-11 MED ORDER — NAPROXEN 500 MG PO TABS: 500.0000 mg | ORAL_TABLET | Freq: Two times a day (BID) | ORAL | 0 refills | Status: DC

## 2022-02-11 MED ORDER — ACETAMINOPHEN 325 MG PO TABS
650.0000 mg | ORAL_TABLET | Freq: Once | ORAL | Status: AC
  Administered 2022-02-11: 650 mg via ORAL
  Filled 2022-02-11: qty 2

## 2022-02-11 NOTE — ED Triage Notes (Signed)
Patient c/o right sided dental abscess x 1.5 weeks. Obvious swelling noted - Patient states his dentist needed to drain it, but his BP was too elevated at this time. Patient is Patient is currently on amoxicillin since Friday.

## 2022-02-11 NOTE — Discharge Instructions (Signed)
It was a pleasure taking care of you today!   You may discontinue taking the amoxicillin, instead, you will be prescribed Penicillin, take as directed and ensure to complete the entire course of the antibiotic. You may take over the counter 600 mg Ibuprofen every 6 hours and alternate with 500 mg Tylenol every 6 hours as directed for no more than 7 days.  Attached is information for the on-call dentist, call and set up a follow-up appointment regarding today's ED visit.  Attached you will find a resource guide for dentists in the area, call and set up a follow up appointment. Return to the Emergency Department if you are experiencing increasing/worsening symptoms.

## 2022-02-11 NOTE — ED Provider Notes (Signed)
[place] EMERGENCY DEPARTMENT Provider Note   CSN: [id number] Arrival date & time: 02/11/22  0849     History  Chief Complaint  Patient presents with   Dental Problem   Abscess     Benjamin Larson is a 46 y.o. male who presents to the Emergency Department complaining of right lower dental pain onset 1.5 weeks. Pt doesn't have a dentist at this time. Notes that he had a similar situation in the past that resolved with antibiotics, however, he was unable to afford dental treatment due to the dentist being out of his network.  Patient was evaluated at urgent care on 11/09/2021 and placed on 875 amoxicillin twice daily.  Has associated facial swelling noted to the area. Tried amoxil and advil for his symptoms. Denies trouble swallowing, shortness of breath. NKDA.      The history is provided by the patient. No language interpreter was used.       Home Medications Prior to Admission medications   Medication Sig Start Date End Date Taking? Authorizing Provider  naproxen (NAPROSYN) 500 MG tablet Take 1 tablet (500 mg total) by mouth 2 (two) times daily. 02/11/22  Yes Victoire Deans A, PA-C  penicillin v potassium (VEETID) 500 MG tablet Take 1 tablet (500 mg total) by mouth 4 (four) times daily for 7 days. 02/11/22 02/18/22 Yes Titilayo Hagans A, PA-C  predniSONE (DELTASONE) 20 MG tablet Take 2 tablets daily with breakfast. 01/19/21   Jaynee Eagles, PA-C      Allergies    Patient has no known allergies.    Review of Systems   Review of Systems  Physical Exam Updated Vital Signs BP (!) 140/95   Pulse 95   Temp 98.7 F (37.1 C) (Oral)   Resp 16   Ht 6' (1.829 m)   Wt 80.7 kg   SpO2 100%   BMI 24.14 kg/m  Physical Exam Vitals and nursing note reviewed.  Constitutional:      General: He is not in acute distress.    Appearance: He is not ill-appearing.  HENT:     Head: Normocephalic and atraumatic.     Comments: Swelling noted to right lower face.    Right Ear:  External ear normal.     Left Ear: External ear normal.     Nose: Nose normal.     Mouth/Throat:     Mouth: Mucous membranes are moist. Mucous membranes are not dry.     Dentition: Abnormal dentition. Dental tenderness and dental caries present.     Tongue: Tongue does not deviate from midline.     Pharynx: Oropharynx is clear. Uvula midline. No oropharyngeal exudate or posterior oropharyngeal erythema.     Tonsils: No tonsillar exudate or tonsillar abscesses.     Comments: Multiple dental caries noted throughout. Tenderness to palpation to right lower gum line. No fluctuance noted. No trismus. No retropharyngeal abscess. No peritonsillar abscess noted. Uvula midline.  Eyes:     Extraocular Movements: Extraocular movements intact.  Cardiovascular:     Rate and Rhythm: Normal rate.  Pulmonary:     Effort: Pulmonary effort is normal. No respiratory distress.  Abdominal:     General: There is no distension.     Palpations: Abdomen is soft.  Musculoskeletal:        General: Normal range of motion.     Cervical back: Neck supple.  Lymphadenopathy:     Head:     Right side of head: No submental, submandibular, tonsillar,  preauricular or posterior auricular adenopathy.     Left side of head: No submental, submandibular, tonsillar, preauricular or posterior auricular adenopathy.     Cervical: No cervical adenopathy.  Skin:    General: Skin is warm and dry.     Findings: No rash.  Neurological:     Mental Status: He is alert.  Psychiatric:        Behavior: Behavior normal.     ED Results / Procedures / Treatments   Labs (all labs ordered are listed, but only abnormal results are displayed) Labs Reviewed - No data to display  EKG None  Radiology No results found.  Procedures Procedures    Medications Ordered in ED Medications  acetaminophen (TYLENOL) tablet 650 mg (has no administration in time range)    ED Course/ Medical Decision Making/ A&P                            Medical Decision Making  Patient presents to the ED with right lower dental pain onset 1.5 weeks.  Patient does not have a dentis.  Vital signs patient afebrile, not tachycardic or hypoxic.  On exam patient with Multiple dental caries noted throughout. Tenderness to palpation to right lower gum line. No fluctuance noted. No trismus. No retropharyngeal abscess. No peritonsillar abscess noted. Uvula midline.  Swelling noted to right lower face. Differential diagnosis includes pharyngeal abscess, dental abscess, Ludwig's angina.   Additional history obtained:  External records from outside source obtained and reviewed including: Patient was evaluated urgent care on 11/09/2021 and sent prescription for 875 amoxicillin twice daily.   Medications:  I ordered medication including tylenol for pain management I have reviewed the patients home medicines and have made adjustments as needed   Disposition: Patient presentation suspicious for dentalgia.  No abscess requiring immediate incision and drainage.  Exam not concerning for Ludwig's angina or pharyngeal abscess. After consideration of the diagnostic results and the patients response to treatment, I feel that the patient would benefit from Discharge home. Will treat with penicillin and Naprosyn prescription.  Patient to discontinue the amoxicillin and start taking penicillin.  Provided resource guide for dentists in the area, patient structured to follow-up with a dentist. Patient provided with information for on-call dentist for follow-up. Supportive care measures and strict return precautions discussed with patient at bedside. Pt acknowledges and verbalizes understanding. Pt appears safe for discharge. Follow up as indicated in discharge paperwork.  This chart was dictated using voice recognition software, Dragon. Despite the best efforts of this provider to proofread and correct errors, errors may still occur which can change documentation  meaning.   Final Clinical Impression(s) / ED Diagnoses Final diagnoses:  Dental caries    Rx / DC Orders ED Discharge Orders          Ordered    naproxen (NAPROSYN) 500 MG tablet  2 times daily        02/11/22 0918    penicillin v potassium (VEETID) 500 MG tablet  4 times daily        02/11/22 0918              Emmely Bittinger A, PA-C 02/11/22 [id number]    Alvira Monday, MD 02/13/22 1030

## 2022-03-02 ENCOUNTER — Encounter: Payer: Self-pay | Admitting: Emergency Medicine

## 2022-03-02 ENCOUNTER — Ambulatory Visit
Admission: EM | Admit: 2022-03-02 | Discharge: 2022-03-02 | Disposition: A | Discharge status: Home / Self Care | Payer: BC Managed Care – PPO

## 2022-03-02 DIAGNOSIS — K047 Periapical abscess without sinus: Secondary | ICD-10-CM

## 2022-03-02 DIAGNOSIS — K0889 Other specified disorders of teeth and supporting structures: Secondary | ICD-10-CM | POA: Diagnosis not present

## 2022-03-02 MED ORDER — CLINDAMYCIN HCL 150 MG PO CAPS: 450.0000 mg | ORAL_CAPSULE | Freq: Three times a day (TID) | ORAL | 0 refills | Status: AC

## 2022-03-02 NOTE — ED Triage Notes (Signed)
Pt is present today with an abscess on the right side of his face. Pt noticed the abscess x2 weeks

## 2022-03-02 NOTE — Discharge Instructions (Signed)
You have a dental infection which is being treated with clindamycin antibiotic.  Take this with food. Please stop taking penicillin antibiotic if you have not already.  Follow-up with dentist for further evaluation and management.

## 2022-03-02 NOTE — ED Provider Notes (Signed)
[facility]    CSN: [id number] Arrival date & time: 03/02/22  1634      History   Chief Complaint Chief Complaint  Patient presents with   Abscess    I feel dehydrated and where the abcess is I'm starting to feel a dull tingling in my neck and up ceat pain level of about 3. - Entered by patient    HPI Benjamin Larson is a 46 y.o. male.   Patient presents with dental pain and swelling on the right side of his face that has been present for about 2 weeks.  Patient saw another provider on 02/11/2022 when symptoms first started and was prescribed penicillin antibiotic with no improvement.  He also states that it caused him to be "really itchy" when he was taking it.  He was also prescribed naproxen but reports that he did not see much improvement with this as well.  He denies fever, body aches, chills.  Has not yet seen a dentist for this.   Abscess   History reviewed. No pertinent past medical history.  Patient Active Problem List   Diagnosis Date Noted   Alcohol abuse 06/24/2018   Seizure (HCC) 06/24/2018   Tobacco abuse 06/24/2018    Past Surgical History:  Procedure Laterality Date   ACHILLES TENDON REPAIR Right    HERNIA REPAIR  2006   groin   PATELLAR TENDON REPAIR Right 2000       Home Medications    Prior to Admission medications   Medication Sig Start Date End Date Taking? Authorizing Provider  clindamycin (CLEOCIN) 150 MG capsule Take 3 capsules (450 mg total) by mouth 3 (three) times daily for 7 days. 03/02/22 03/09/22 Yes Josep Luviano, Acie Fredrickson, FNP  naproxen (NAPROSYN) 500 MG tablet Take 1 tablet (500 mg total) by mouth 2 (two) times daily. 02/11/22   Blue, Soijett A, PA-C  predniSONE (DELTASONE) 20 MG tablet Take 2 tablets daily with breakfast. 01/19/21   Wallis Bamberg, PA-C    Family History Family History  Problem Relation Age of Onset   Heart attack Mother    Stroke Mother    Diabetes Mother        pt thinks she has diabetes   Seizures Neg  Hx     Social History Social History   Tobacco Use   Smoking status: Every Day    Packs/day: 0.50    Types: Cigarettes   Smokeless tobacco: Never  Vaping Use   Vaping Use: Never used  Substance Use Topics   Alcohol use: Yes    Alcohol/week: 28.0 - 35.0 standard drinks of alcohol    Types: 28 - 35 Standard drinks or equivalent per week    Comment: daily 4-5 drinks    Drug use: No     Allergies   Penicillins   Review of Systems Review of Systems Per HPI  Physical Exam Triage Vital Signs ED Triage Vitals  Enc Vitals Group     BP 03/02/22 1710 (!) 136/92     Pulse Rate 03/02/22 1708 99     Resp 03/02/22 1708 18     Temp 03/02/22 1708 99.3 F (37.4 C)     Temp src --      SpO2 03/02/22 1708 98 %     Weight --      Height --      Head Circumference --      Peak Flow --      Pain Score 03/02/22 1707 10  Pain Loc --      Pain Edu? --      Excl. in GC? --    No data found.  Updated Vital Signs BP (!) 136/92   Pulse 99   Temp 99.3 F (37.4 C)   Resp 18   SpO2 98%   Visual Acuity Right Eye Distance:   Left Eye Distance:   Bilateral Distance:    Right Eye Near:   Left Eye Near:    Bilateral Near:     Physical Exam Constitutional:      General: He is not in acute distress.    Appearance: Normal appearance. He is not toxic-appearing or diaphoretic.  HENT:     Head: Normocephalic and atraumatic.     Mouth/Throat:     Lips: Pink.     Mouth: Mucous membranes are moist.     Dentition: Abnormal dentition. Dental tenderness and gingival swelling present.     Pharynx: No pharyngeal swelling or oropharyngeal exudate.      Comments: Significant erythema and gingival swelling present surrounding right lower back dentition.  Patient appears to have a broken and decaying tooth in the back where this is located. Eyes:     Extraocular Movements: Extraocular movements intact.     Conjunctiva/sclera: Conjunctivae normal.  Pulmonary:     Effort: Pulmonary  effort is normal.  Neurological:     General: No focal deficit present.     Mental Status: He is alert and oriented to person, place, and time. Mental status is at baseline.  Psychiatric:        Mood and Affect: Mood normal.        Behavior: Behavior normal.        Thought Content: Thought content normal.        Judgment: Judgment normal.      UC Treatments / Results  Labs (all labs ordered are listed, but only abnormal results are displayed) Labs Reviewed - No data to display  EKG   Radiology No results found.  Procedures Procedures (including critical care time)  Medications Ordered in UC Medications - No data to display  Initial Impression / Assessment and Plan / UC Course  I have reviewed the triage vital signs and the nursing notes.  Pertinent labs & imaging results that were available during my care of the patient were reviewed by me and considered in my medical decision making (see chart for details).     Patient has dental infection.  Symptoms have been refractory to penicillin so will treat with clindamycin.  Patient advised to take this with food to avoid stomach upset. No concern for airway compromise at this time.  Patient offered pain medication but declined stating that he had ibuprofen at home that he preferred to take.  Patient was advised of the importance of following up with dentist and dental resources paperwork provided for patient.  Patient was given strict return precautions.  Patient verbalized understanding and was agreeable with plan. Final Clinical Impressions(s) / UC Diagnoses   Final diagnoses:  Dental infection  Pain, dental     Discharge Instructions      You have a dental infection which is being treated with clindamycin antibiotic.  Take this with food. Please stop taking penicillin antibiotic if you have not already.  Follow-up with dentist for further evaluation and management.    ED Prescriptions     Medication Sig Dispense  Auth. Provider   clindamycin (CLEOCIN) 150 MG capsule Take 3 capsules (450  mg total) by mouth 3 (three) times daily for 7 days. [address] capsule [place], Acie Fredrickson, [place]      PDMP not reviewed this encounter.   Gustavus Bryant, [place] 03/02/22 [phone]

## 2022-03-05 ENCOUNTER — Emergency Department (HOSPITAL_BASED_OUTPATIENT_CLINIC_OR_DEPARTMENT_OTHER)
Admission: EM | Admit: 2022-03-05 | Discharge: 2022-03-05 | Disposition: A | Discharge status: Home / Self Care | Payer: BC Managed Care – PPO | Attending: Emergency Medicine | Admitting: Emergency Medicine

## 2022-03-05 ENCOUNTER — Emergency Department (HOSPITAL_BASED_OUTPATIENT_CLINIC_OR_DEPARTMENT_OTHER): Payer: BC Managed Care – PPO

## 2022-03-05 ENCOUNTER — Other Ambulatory Visit: Payer: Self-pay

## 2022-03-05 DIAGNOSIS — K047 Periapical abscess without sinus: Secondary | ICD-10-CM | POA: Diagnosis not present

## 2022-03-05 DIAGNOSIS — K0889 Other specified disorders of teeth and supporting structures: Secondary | ICD-10-CM | POA: Diagnosis present

## 2022-03-05 LAB — CBC WITH DIFFERENTIAL/PLATELET
Abs Immature Granulocytes: 0.02 10*3/uL (ref 0.00–0.07)
Basophils Absolute: 0 10*3/uL (ref 0.0–0.1)
Basophils Relative: 0 %
Eosinophils Absolute: 0 10*3/uL (ref 0.0–0.5)
Eosinophils Relative: 0 %
HCT: 37 % — ABNORMAL LOW (ref 39.0–52.0)
Hemoglobin: 12.5 g/dL — ABNORMAL LOW (ref 13.0–17.0)
Immature Granulocytes: 0 %
Lymphocytes Relative: 18 %
Lymphs Abs: 1.5 10*3/uL (ref 0.7–4.0)
MCH: 35.3 pg — ABNORMAL HIGH (ref 26.0–34.0)
MCHC: 33.8 g/dL (ref 30.0–36.0)
MCV: 104.5 fL — ABNORMAL HIGH (ref 80.0–100.0)
Monocytes Absolute: 1.1 10*3/uL — ABNORMAL HIGH (ref 0.1–1.0)
Monocytes Relative: 13 %
Neutro Abs: 5.7 10*3/uL (ref 1.7–7.7)
Neutrophils Relative %: 69 %
Platelets: 138 10*3/uL — ABNORMAL LOW (ref 150–400)
RBC: 3.54 MIL/uL — ABNORMAL LOW (ref 4.22–5.81)
RDW: 13.1 % (ref 11.5–15.5)
WBC: 8.3 10*3/uL (ref 4.0–10.5)
nRBC: 0 % (ref 0.0–0.2)

## 2022-03-05 LAB — BASIC METABOLIC PANEL
Anion gap: 11 (ref 5–15)
BUN: 11 mg/dL (ref 6–20)
CO2: 24 mmol/L (ref 22–32)
Calcium: 9.2 mg/dL (ref 8.9–10.3)
Chloride: 100 mmol/L (ref 98–111)
Creatinine, Ser: 0.78 mg/dL (ref 0.61–1.24)
GFR, Estimated: >60 mL/min (ref >60)
Glucose, Bld: 93 mg/dL (ref 70–99)
Potassium: 3.5 mmol/L (ref 3.5–5.1)
Sodium: 135 mmol/L (ref 135–145)

## 2022-03-05 MED ORDER — LIDOCAINE HCL (PF) 1 % IJ SOLN
10.0000 mL | Freq: Once | INTRAMUSCULAR | Status: DC
  Filled 2022-03-05: qty 10

## 2022-03-05 MED ORDER — HYDROCODONE-ACETAMINOPHEN 5-325 MG PO TABS: 1.0000 | ORAL_TABLET | Freq: Four times a day (QID) | ORAL | 0 refills | Status: DC | PRN

## 2022-03-05 MED ORDER — IOHEXOL 300 MG/ML  SOLN
75.0000 mL | Freq: Once | INTRAMUSCULAR | Status: AC | PRN
  Administered 2022-03-05: 75 mL via INTRAVENOUS

## 2022-03-05 NOTE — ED Triage Notes (Signed)
Patient presents to ED via POV from home. Here with abscess to right jaw. Currently on antibiotics for same. Reports it is not getting better.

## 2022-03-05 NOTE — ED Provider Notes (Signed)
[facility] EMERGENCY DEPARTMENT Provider Note   CSN: [id number] Arrival date & time: 03/05/22  1514     History  Chief Complaint  Patient presents with   Abscess    Benjamin Larson is a 46 y.o. male.  Wendee Copp past medical history presents today with complaints of facial abscess.  Benjamin Larson states that Benjamin Larson has had right-sided dental pain for the past month and has been seen for same twice.  States the first time Benjamin Larson was diagnosed with a dental cavity and given penicillin and anti-inflammatory medication and told to follow-up with a dentist.  Benjamin Larson was seen again by urgent care and switch to clindamycin which she has been taking as prescribed.  States that over the past week Benjamin Larson has had progressive swelling and pain to his right lower face and is concerned that Benjamin Larson has an abscess.  Benjamin Larson has not followed up with a dentist.  Denies fevers or chills.  Does endorse some trouble swallowing due to the swelling in his face.  Denies any painful eye movements or difficulty breathing.  The history is provided by the patient. No language interpreter was used.  Abscess      Home Medications Prior to Admission medications   Medication Sig Start Date End Date Taking? Authorizing Provider  clindamycin (CLEOCIN) 150 MG capsule Take 3 capsules (450 mg total) by mouth 3 (three) times daily for 7 days. 03/02/22 03/09/22  Gustavus Bryant, FNP  naproxen (NAPROSYN) 500 MG tablet Take 1 tablet (500 mg total) by mouth 2 (two) times daily. 02/11/22   Blue, Soijett A, PA-C  predniSONE (DELTASONE) 20 MG tablet Take 2 tablets daily with breakfast. 01/19/21   Wallis Bamberg, PA-C      Allergies    Penicillins    Review of Systems   Review of Systems  HENT:  Positive for dental problem.   All other systems reviewed and are negative.   Physical Exam Updated Vital Signs BP (!) 157/92 (BP Location: Left Arm)   Pulse 99   Temp 98 F (36.7 C)   Resp 18   Ht 6' (1.829 m)   Wt 77.1 kg   SpO2 100%   BMI  23.06 kg/m  Physical Exam Vitals and nursing note reviewed.  Constitutional:      General: Benjamin Larson is not in acute distress.    Appearance: Normal appearance. Benjamin Larson is normal weight. Benjamin Larson is not ill-appearing, toxic-appearing or diaphoretic.  HENT:     Head: Normocephalic and atraumatic.     Mouth/Throat:     Comments: Dentition poor throughout. Area of fluctuance with surrounding induration noted to the right lower jaw area. No swelling under the tongue, no concern for Ludwig's angina. No trismus or crepitus.  Eyes:     Extraocular Movements: Extraocular movements intact.     Pupils: Pupils are equal, round, and reactive to light.     Comments: No periorbital swelling, EOMs intact without pain.  Neck:     Comments: No meningismus Cardiovascular:     Rate and Rhythm: Normal rate.  Pulmonary:     Effort: Pulmonary effort is normal. No respiratory distress.  Musculoskeletal:        General: Normal range of motion.     Cervical back: Normal range of motion and neck supple.  Skin:    General: Skin is warm and dry.  Neurological:     General: No focal deficit present.     Mental Status: Benjamin Larson is alert.  Psychiatric:  Mood and Affect: Mood normal.        Behavior: Behavior normal.     ED Results / Procedures / Treatments   Labs (all labs ordered are listed, but only abnormal results are displayed) Labs Reviewed  CBC WITH DIFFERENTIAL/PLATELET - Abnormal; Notable for the following components:      Result Value   RBC 3.54 (*)    Hemoglobin 12.5 (*)    HCT 37.0 (*)    MCV 104.5 (*)    MCH 35.3 (*)    Platelets 138 (*)    Monocytes Absolute 1.1 (*)    All other components within normal limits  BASIC METABOLIC PANEL    EKG None  Radiology CT Maxillofacial W Contrast  Result Date: 03/05/2022 CLINICAL DATA:  Abscess to right jaw, on antibiotics. EXAM: CT MAXILLOFACIAL WITH CONTRAST TECHNIQUE: Multidetector CT imaging of the maxillofacial structures was performed with  intravenous contrast. Multiplanar CT image reconstructions were also generated. RADIATION DOSE REDUCTION: This exam was performed according to the departmental dose-optimization program which includes automated exposure control, adjustment of the mA and/or kV according to patient size and/or use of iterative reconstruction technique. CONTRAST:  3mL OMNIPAQUE IOHEXOL 300 MG/ML  SOLN COMPARISON:  None Available. FINDINGS: Osseous: Dental caries, most pertinent to this study is a carious right lower second molar with periapical lucency and lateral dehiscence leading to a buccal sided abscess that measures 13 x 6 mm. Additional more superficial right cheek abscess measuring 14 mm, continuity not clearly established. The regional soft tissues are thickened and infiltrated. No sublingual extension noted. The covered airways patent Orbits: Negative Sinuses: Mucosal thickening along the floor the left maxillary sinus. Soft tissues: Soft tissue abscess as above. Expected adenopathy in the upper neck. Notable cervical carotid atherosclerosis for age. Limited intracranial: Negative IMPRESSION: 2 right buccal abscesses measuring up to 14 mm and 13 mm, odontogenic from tooth 31. Electronically Signed   By: Tiburcio Pea M.D.   On: 03/05/2022 18:46    Procedures .Marland KitchenIncision and Drainage  Date/Time: 03/05/2022 7:29 PM  Performed by: Silva Bandy, PA-C Authorized by: Silva Bandy, PA-C   Consent:    Consent obtained:  Verbal   Consent given by:  Patient   Risks, benefits, and alternatives were discussed: yes     Risks discussed:  Bleeding, incomplete drainage, pain, infection and damage to other organs   Alternatives discussed:  No treatment, delayed treatment, alternative treatment, observation and referral Universal protocol:    Procedure explained and questions answered to patient or proxy's satisfaction: yes     Test results available : yes     Imaging studies available: yes     Patient identity  confirmed:  Verbally with patient Location:    Type:  Abscess   Size:  3 cm x 3 cm   Location:  Head   Head location:  Face Pre-procedure details:    Skin preparation:  Povidone-iodine Anesthesia:    Anesthesia method:  Local infiltration   Local anesthetic:  Lidocaine 1% w/o epi Procedure type:    Complexity:  Simple Procedure details:    Incision types:  Stab incision   Incision depth:  Dermal   Wound management:  Probed and deloculated and irrigated with saline   Drainage:  Bloody and purulent   Drainage amount:  Copious   Wound treatment:  Wound left open Post-procedure details:    Procedure completion:  Tolerated well, no immediate complications     Medications Ordered in ED Medications  lidocaine (PF) (XYLOCAINE) 1 % injection 10 mL (has no administration in time range)  iohexol (OMNIPAQUE) 300 MG/ML solution 75 mL (75 mLs Intravenous Contrast Given 03/05/22 1823)    ED Course/ Medical Decision Making/ A&P                           Medical Decision Making Amount and/or Complexity of Data Reviewed Labs: ordered. Radiology: ordered.  Risk Prescription drug management.   This patient is a 46 y.o. male who presents to the ED for concern of facial abscess, this involves an extensive number of treatment options, and is a complaint that carries with it a high risk of complications and morbidity. The emergent differential diagnosis prior to evaluation includes, but is not limited to,  Ludwig's angina, abscess .   This is not an exhaustive differential.   Past Medical History / Co-morbidities / Social History: none  Additional history: Chart reviewed. Pertinent results include: patient currently on clindamycin for dental infection and has been complaint.   Physical Exam: Physical exam performed. The pertinent findings include: right sided facial abscess  Lab Tests: I ordered, and personally interpreted labs.  The pertinent results include:  no acute laboratory  findings.   Imaging Studies: I ordered imaging studies including CT max/face. I independently visualized and interpreted imaging which showed   2 right buccal abscesses measuring up to 14 mm and 13 mm, odontogenic from tooth 31.  I agree with the radiologist interpretation.     Disposition:  Patient with right-sided face abscess.  Benjamin Larson is afebrile, nontoxic-appearing, and in no acute distress with reassuring vital signs.  CT imaging reassuring.  Abscess amenable to incision and drainage for above procedure.  Given Vicodin for pain.  PDMP reviewed.  Abscess was not large enough to warrant packing or drain,  wound recheck in 2 days. Encouraged home warm soaks and flushing.  Mild signs of cellulitis is surrounding skin.  Will d/c to home.  Patient is currently on clindamycin, will recommend that Benjamin Larson complete this course.  I have also emphasized the importance of following up with a dentist for tooth extraction at his earliest convenience.  Patient is understanding and amenable with plan, educated on red flag symptoms of prompt immediate return.  Patient discharged in stable condition.   Final Clinical Impression(s) / ED Diagnoses Final diagnoses:  Dental abscess    Rx / DC Orders ED Discharge Orders          Ordered    HYDROcodone-acetaminophen (NORCO/VICODIN) 5-325 MG tablet  Every 6 hours PRN        03/05/22 1902          An After Visit Summary was printed and given to the patient.     Vear Clock 03/05/22 1934    Glynn Octave, MD 03/06/22 0040

## 2022-03-05 NOTE — Discharge Instructions (Addendum)
You were seen in the emergency department for an abscess.  We have drained the area and cleaned it. I would like you to have the wound checked in 2-3 days. This can be done by any doctor's office, urgent care, or emergency department. This is to make sure the area hasn't closed too soon. Try to keep the area as clean as possible and wash regularly with soap and water and even stand with your face in the shower and let the water spray into the wound to ensure it heals properly from the inside out.   Please continue to take the antibiotics that were prescribed to you previously. It is important you finish the entire course!  I have also given you a prescription for Vicodin which is a narcotic for you to take as prescribed as needed for severe pain only.  Do not drive or operate heavy machinery while taking this medication.  You may also take ibuprofen.  Please follow-up with your dentist at your earliest convenience for definitive management of your symptoms.   Continue to monitor how you're doing and return to the ER for new or worsening symptoms.

## 2022-12-11 ENCOUNTER — Emergency Department (HOSPITAL_COMMUNITY)
Admission: EM | Admit: 2022-12-11 | Discharge: 2022-12-12 | Discharge status: Against Medical Advice | Payer: 59 | Attending: Emergency Medicine | Admitting: Emergency Medicine

## 2022-12-11 ENCOUNTER — Ambulatory Visit
Admission: EM | Admit: 2022-12-11 | Discharge: 2022-12-11 | Disposition: A | Discharge status: Other Acute Inpatient Hospital | Payer: 59 | Attending: Emergency Medicine | Admitting: Emergency Medicine

## 2022-12-11 DIAGNOSIS — Z5321 Procedure and treatment not carried out due to patient leaving prior to being seen by health care provider: Secondary | ICD-10-CM | POA: Diagnosis not present

## 2022-12-11 DIAGNOSIS — E119 Type 2 diabetes mellitus without complications: Secondary | ICD-10-CM | POA: Diagnosis not present

## 2022-12-11 DIAGNOSIS — R1011 Right upper quadrant pain: Secondary | ICD-10-CM | POA: Insufficient documentation

## 2022-12-11 DIAGNOSIS — X58XXXA Exposure to other specified factors, initial encounter: Secondary | ICD-10-CM | POA: Insufficient documentation

## 2022-12-11 DIAGNOSIS — R1031 Right lower quadrant pain: Secondary | ICD-10-CM | POA: Diagnosis not present

## 2022-12-11 DIAGNOSIS — Z79899 Other long term (current) drug therapy: Secondary | ICD-10-CM | POA: Insufficient documentation

## 2022-12-11 DIAGNOSIS — S20211A Contusion of right front wall of thorax, initial encounter: Secondary | ICD-10-CM | POA: Diagnosis not present

## 2022-12-11 LAB — POCT URINALYSIS DIP (MANUAL ENTRY)
Blood, UA: NEGATIVE
Glucose, UA: NEGATIVE mg/dL
Ketones, POC UA: NEGATIVE mg/dL
Leukocytes, UA: NEGATIVE
Nitrite, UA: NEGATIVE
Protein Ur, POC: 30 mg/dL — AB
Spec Grav, UA: >=1.03 — AB (ref 1.010–1.025)
Urobilinogen, UA: 1 E.U./dL
pH, UA: 6 (ref 5.0–8.0)

## 2022-12-11 LAB — COMPREHENSIVE METABOLIC PANEL
ALT: 64 U/L — ABNORMAL HIGH (ref 0–44)
AST: 93 U/L — ABNORMAL HIGH (ref 15–41)
Albumin: 3.8 g/dL (ref 3.5–5.0)
Alkaline Phosphatase: 111 U/L (ref 38–126)
Anion gap: 15 (ref 5–15)
BUN: 8 mg/dL (ref 6–20)
CO2: 24 mmol/L (ref 22–32)
Calcium: 9.6 mg/dL (ref 8.9–10.3)
Chloride: 98 mmol/L (ref 98–111)
Creatinine, Ser: 0.78 mg/dL (ref 0.61–1.24)
GFR, Estimated: >60 mL/min (ref >60)
Glucose, Bld: 92 mg/dL (ref 70–99)
Potassium: 3.7 mmol/L (ref 3.5–5.1)
Sodium: 137 mmol/L (ref 135–145)
Total Bilirubin: 1 mg/dL (ref 0.3–1.2)
Total Protein: 7 g/dL (ref 6.5–8.1)

## 2022-12-11 LAB — CBC WITH DIFFERENTIAL/PLATELET
Abs Immature Granulocytes: 0.02 10*3/uL (ref 0.00–0.07)
Basophils Absolute: 0 10*3/uL (ref 0.0–0.1)
Basophils Relative: 1 %
Eosinophils Absolute: 0.1 10*3/uL (ref 0.0–0.5)
Eosinophils Relative: 1 %
HCT: 36 % — ABNORMAL LOW (ref 39.0–52.0)
Hemoglobin: 12 g/dL — ABNORMAL LOW (ref 13.0–17.0)
Immature Granulocytes: 0 %
Lymphocytes Relative: 31 %
Lymphs Abs: 2.3 10*3/uL (ref 0.7–4.0)
MCH: 35.8 pg — ABNORMAL HIGH (ref 26.0–34.0)
MCHC: 33.3 g/dL (ref 30.0–36.0)
MCV: 107.5 fL — ABNORMAL HIGH (ref 80.0–100.0)
Monocytes Absolute: 0.7 10*3/uL (ref 0.1–1.0)
Monocytes Relative: 10 %
Neutro Abs: 4.2 10*3/uL (ref 1.7–7.7)
Neutrophils Relative %: 57 %
Platelets: 222 10*3/uL (ref 150–400)
RBC: 3.35 MIL/uL — ABNORMAL LOW (ref 4.22–5.81)
RDW: 15 % (ref 11.5–15.5)
WBC: 7.3 10*3/uL (ref 4.0–10.5)
nRBC: 0 % (ref 0.0–0.2)

## 2022-12-11 LAB — LIPASE, BLOOD: Lipase: 22 U/L (ref 11–51)

## 2022-12-11 MED ORDER — KETOROLAC TROMETHAMINE 30 MG/ML IJ SOLN: 30.0000 mg | Freq: Once | INTRAMUSCULAR | Status: DC

## 2022-12-11 MED ORDER — ONDANSETRON 4 MG PO TBDP
4.0000 mg | ORAL_TABLET | Freq: Once | ORAL | Status: AC
  Administered 2022-12-11: 4 mg via ORAL

## 2022-12-11 MED ORDER — KETOROLAC TROMETHAMINE 30 MG/ML IJ SOLN
30.0000 mg | Freq: Once | INTRAMUSCULAR | Status: AC
  Administered 2022-12-11: 30 mg via INTRAMUSCULAR

## 2022-12-11 NOTE — ED Notes (Signed)
Patient is being discharged from the Urgent Care and sent to the Emergency Department via POV . Per AM, patient is in need of higher level of care due to abd pain. Patient is aware and verbalizes understanding of plan of care.  Vitals:   12/11/22 1752 12/11/22 1754  BP: (!) 144/85 (!) 140/88  Pulse: 78   Resp: 18   Temp: 98.1 F (36.7 C)   SpO2: 97%

## 2022-12-11 NOTE — ED Triage Notes (Signed)
Pt to ED POV from UC. Pt c/o RUQ and right flank pain x2 days. Pt was seen at [facility] and given zofran and Toradol and was sent to ED for further evaluation.

## 2022-12-11 NOTE — ED Provider Triage Note (Signed)
Emergency Medicine Provider Triage Evaluation Note  Benjamin Larson , a 47 y.o. male  was evaluated in triage.  Pt complains of abdominal pain.  Patient was seen earlier tonight at urgent care and advised to come to the emergency department for further evaluation of the abdominal pain.  Patient reports the pain is primarily in the right side of his abdomen with some pain in the right upper quadrant as well as right lower quadrant.  No prior history of liver disease, cholecystitis, appendicitis.  Denies any history of IV drug use.  No recent hematuria or urinary symptoms.  Denies any fevers reports maybe some subjective temperature at home.  Review of Systems  Positive: As above Negative: As above  Physical Exam  BP (!) 162/94   Pulse 61   Temp 98.7 F (37.1 C)   Resp 18   SpO2 100%  Gen:   Awake, no distress   Resp:  Normal effort  MSK:   Moves extremities without difficulty  Other:  Some tenderness to palpation along the right side of the abdomen.  No significant right upper quadrant right lower quadrant tenderness at this time.  Some mild right CVA tenderness.  Medical Decision Making  Medically screening exam initiated at 8:57 PM.  Appropriate orders placed.  [name] was informed that the remainder of the evaluation will be completed by another provider, this initial triage assessment does not replace that evaluation, and the importance of remaining in the ED until their evaluation is complete.     Smitty Knudsen, PA-C 12/11/22 2057

## 2022-12-11 NOTE — ED Triage Notes (Signed)
"  I am having upper right flank abdominal pain" the last few days. No injury known. No visual changes. No dysuria. No urinary problems. Stools "normal". No nausea. No vomiting. No blood seen in urine. No sob.

## 2022-12-11 NOTE — ED Provider Notes (Signed)
HPI  SUBJECTIVE:  Benjamin Larson is a 47 y.o. male who presents with 2 days of constant, nonmigratory, nonradiating right upper quadrant pain described as soreness.  He states it feels as if he got "hit in the side with a baseball bat".  He reports tenderness when he pushes in in this area.  He denies trauma, bruising.  He denies coughing, wheezing, shortness of breath, chest pain, calf pain or swelling, abdominal or back pain.  No urinary complaints.  He reports sharp pain with inspiration.  He had fevers Tmax 100 with vomiting 5 to 6 days ago, but this was attributed to a viral illness and has resolved.  No nausea.  No further episodes of emesis.  Denies alcohol use, or over ingestion of Tylenol.  Car ride over here was not painful.  He has never had symptoms like this before.  He has not tried anything for this.  No alleviating factors.  Symptoms are worse with deep inspiration.  They are not associated with p.o. intake, urination or defecation.  Past medical history negative for hepatitis, gallbladder disease, pneumonia, pneumothorax, PE, DVT, cancer, UTI, pyelonephritis, abdominal surgeries.  PCP: None.   History reviewed. No pertinent past medical history.  Past Surgical History:  Procedure Laterality Date   ACHILLES TENDON REPAIR Right    HERNIA REPAIR  2006   groin   PATELLAR TENDON REPAIR Right 2000    Family History  Problem Relation Age of Onset   Heart attack Mother    Stroke Mother    Diabetes Mother        pt thinks she has diabetes   Seizures Neg Hx     Social History   Tobacco Use   Smoking status: Every Day    Current packs/day: 0.50    Types: Cigarettes   Smokeless tobacco: Never  Vaping Use   Vaping status: Never Used  Substance Use Topics   Alcohol use: Yes    Alcohol/week: 28.0 - 35.0 standard drinks of alcohol    Types: 28 - 35 Standard drinks or equivalent per week    Comment: daily 4-5 drinks    Drug use: No    No current facility-administered  medications for this encounter. No current outpatient medications on file.  Allergies  Allergen Reactions   Penicillins Itching     ROS  As noted in HPI.   Physical Exam  BP (!) 140/88 (BP Location: Left Arm)   Pulse 78   Temp 98.1 F (36.7 C) (Oral)   Resp 18   Ht 6' (1.829 m)   Wt 77.1 kg   SpO2 97%   BMI 23.06 kg/m   Constitutional: Well developed, well nourished, no acute distress Eyes:  EOMI, conjunctiva normal bilaterally HENT: Normocephalic, atraumatic,mucus membranes moist Respiratory: Normal inspiratory effort, lungs clear bilaterally.  No tenderness over the lower ribs. Cardiovascular: Normal rate, regular rhythm, no murmurs rubs or gallops GI: nondistended, normal appearance, soft.  Positive right upper quadrant tenderness.  Negative Murphy, negative McBurney.  Active bowel sounds, no rebound, guarding. Back: No CVAT skin: No rash, skin intact Musculoskeletal: Symmetric, nontender, no edema Neurologic: Alert & oriented x 3, no focal neuro deficits Psychiatric: Speech and behavior appropriate   ED Course   Medications  ondansetron (ZOFRAN-ODT) disintegrating tablet 4 mg (4 mg Oral Given 12/11/22 1938)  ketorolac (TORADOL) 30 MG/ML injection 30 mg (30 mg Intramuscular Given 12/11/22 1938)    Orders Placed This Encounter  Procedures   POCT urinalysis dipstick  Standing Status:   Standing    Number of Occurrences:   1    Results for orders placed or performed during the hospital encounter of 12/11/22 (from the past 24 hour(s))  POCT urinalysis dipstick     Status: Abnormal   Collection Time: 12/11/22  6:50 PM  Result Value Ref Range   Color, UA yellow yellow   Clarity, UA clear clear   Glucose, UA negative negative mg/dL   Bilirubin, UA small (A) negative   Ketones, POC UA negative negative mg/dL   Spec Grav, UA >=2.956 (A) 1.010 - 1.025   Blood, UA negative negative   pH, UA 6.0 5.0 - 8.0   Protein Ur, POC =30 (A) negative mg/dL    Urobilinogen, UA 1.0 0.2 or 1.0 E.U./dL   Nitrite, UA Negative Negative   Leukocytes, UA Negative Negative   No results found.  ED Clinical Impression  1. Abdominal pain, right upper quadrant      ED Assessment/Plan     UA concentrated.  Small bilirubin, proteinuria.  Negative hematuria, glucosuria, UTI.   Primary concern is liver or gallbladder disease such as acute hepatitis, cholecystitis/cholelithiasis.  In the differential is  SBO, mesenteric ischemia, appendicitis,  pancreatitis, perforated viscus, pneumonia, PE.  Doubt nephrolithiasis or pyelonephritis.  Giving Toradol 30 mg IM here and Zofran 4 mg p.o. ODT.  Advised patient to be n.p.o. until ER evaluation is complete.  Sending to [facility] ED for comprehensive evaluation.  He is stable to go via private vehicle.  Discussed rationale for transfer to the emergency department with the patient.  He agrees to go.  Meds ordered this encounter  Medications   DISCONTD: ketorolac (TORADOL) 30 MG/ML injection 30 mg   ondansetron (ZOFRAN-ODT) disintegrating tablet 4 mg   ketorolac (TORADOL) 30 MG/ML injection 30 mg      *This clinic note was created using Scientist, clinical (histocompatibility and immunogenetics). Therefore, there may be occasional mistakes despite careful proofreading.  ?    Domenick Gong, MD 12/11/22 1945

## 2022-12-11 NOTE — ED Notes (Signed)
Pt left without being seen.

## 2022-12-11 NOTE — Discharge Instructions (Signed)
I have given you Toradol 30 mg IM and Zofran 4 mg p.o.  Do not have anything to eat or drink until your ER evaluation is complete.  I am concerned about your liver and/or your gallbladder.  Please let them know if your pain changes, gets worse, or for any concerns.

## 2022-12-12 ENCOUNTER — Emergency Department (HOSPITAL_COMMUNITY)
Admission: EM | Admit: 2022-12-12 | Discharge: 2022-12-12 | Disposition: A | Discharge status: Home / Self Care | Payer: 59 | Source: Home / Self Care | Attending: Emergency Medicine | Admitting: Emergency Medicine

## 2022-12-12 ENCOUNTER — Encounter (HOSPITAL_COMMUNITY): Payer: Self-pay

## 2022-12-12 ENCOUNTER — Emergency Department (HOSPITAL_COMMUNITY): Payer: 59

## 2022-12-12 ENCOUNTER — Other Ambulatory Visit: Payer: Self-pay

## 2022-12-12 DIAGNOSIS — S20211A Contusion of right front wall of thorax, initial encounter: Secondary | ICD-10-CM | POA: Insufficient documentation

## 2022-12-12 DIAGNOSIS — X58XXXA Exposure to other specified factors, initial encounter: Secondary | ICD-10-CM | POA: Insufficient documentation

## 2022-12-12 DIAGNOSIS — R1011 Right upper quadrant pain: Secondary | ICD-10-CM | POA: Insufficient documentation

## 2022-12-12 LAB — CBC WITH DIFFERENTIAL/PLATELET
Abs Immature Granulocytes: 0.02 10*3/uL (ref 0.00–0.07)
Basophils Absolute: 0 10*3/uL (ref 0.0–0.1)
Basophils Relative: 0 %
Eosinophils Absolute: 0.1 10*3/uL (ref 0.0–0.5)
Eosinophils Relative: 2 %
HCT: 35.9 % — ABNORMAL LOW (ref 39.0–52.0)
Hemoglobin: 11.8 g/dL — ABNORMAL LOW (ref 13.0–17.0)
Immature Granulocytes: 0 %
Lymphocytes Relative: 32 %
Lymphs Abs: 1.8 10*3/uL (ref 0.7–4.0)
MCH: 35.9 pg — ABNORMAL HIGH (ref 26.0–34.0)
MCHC: 32.9 g/dL (ref 30.0–36.0)
MCV: 109.1 fL — ABNORMAL HIGH (ref 80.0–100.0)
Monocytes Absolute: 0.5 10*3/uL (ref 0.1–1.0)
Monocytes Relative: 9 %
Neutro Abs: 3.3 10*3/uL (ref 1.7–7.7)
Neutrophils Relative %: 57 %
Platelets: 210 10*3/uL (ref 150–400)
RBC: 3.29 MIL/uL — ABNORMAL LOW (ref 4.22–5.81)
RDW: 14.8 % (ref 11.5–15.5)
WBC: 5.8 10*3/uL (ref 4.0–10.5)
nRBC: 0 % (ref 0.0–0.2)

## 2022-12-12 LAB — COMPREHENSIVE METABOLIC PANEL
ALT: 65 U/L — ABNORMAL HIGH (ref 0–44)
AST: 107 U/L — ABNORMAL HIGH (ref 15–41)
Albumin: 3.7 g/dL (ref 3.5–5.0)
Alkaline Phosphatase: 100 U/L (ref 38–126)
Anion gap: 13 (ref 5–15)
BUN: 9 mg/dL (ref 6–20)
CO2: 22 mmol/L (ref 22–32)
Calcium: 9.2 mg/dL (ref 8.9–10.3)
Chloride: 101 mmol/L (ref 98–111)
Creatinine, Ser: 0.67 mg/dL (ref 0.61–1.24)
GFR, Estimated: >60 mL/min (ref >60)
Glucose, Bld: 97 mg/dL (ref 70–99)
Potassium: 3.4 mmol/L — ABNORMAL LOW (ref 3.5–5.1)
Sodium: 136 mmol/L (ref 135–145)
Total Bilirubin: 1.2 mg/dL (ref 0.3–1.2)
Total Protein: 6.8 g/dL (ref 6.5–8.1)

## 2022-12-12 LAB — LIPASE, BLOOD: Lipase: 21 U/L (ref 11–51)

## 2022-12-12 NOTE — Discharge Instructions (Addendum)
Return for any problem.  Ibuprofen and/or Tylenol as instructed for pain.

## 2022-12-12 NOTE — ED Notes (Signed)
Pt d/c . All instructions given. All questions given

## 2022-12-12 NOTE — ED Provider Notes (Signed)
[place] [facility] AT [facility] Provider Note   CSN: [id number] Arrival date & time: 12/12/22  1044     History  Chief Complaint  Patient presents with   Abdominal Pain    Benjamin Larson is a 47 y.o. male.  47 year old male with prior medical history as detailed below presents for evaluation.  Patient complains of approximately 2 to 3 days of pain to the right flank.  Patient localizes the pain to a discrete area overlying the right lateral inferior ribs.  He denies associated nausea, vomiting, diarrhea.  He denies fever.  He denies trauma.  He denies shortness of breath or chest pain.  Pain is worse with movement or with stretching of the ribs.  Patient reports improvement with administration of Toradol at urgent care.  The history is provided by the patient and medical records.       Home Medications Prior to Admission medications   Not on File      Allergies    Penicillins    Review of Systems   Review of Systems  All other systems reviewed and are negative.   Physical Exam Updated Vital Signs BP (!) 154/97 (BP Location: Right Arm)   Pulse 80   Temp 98.5 F (36.9 C) (Oral)   Resp 16   Ht 6' (1.829 m)   Wt 77.1 kg   SpO2 100%   BMI 23.06 kg/m  Physical Exam Vitals and nursing note reviewed.  Constitutional:      General: He is not in acute distress.    Appearance: Normal appearance. He is well-developed.  HENT:     Head: Normocephalic and atraumatic.  Eyes:     Conjunctiva/sclera: Conjunctivae normal.     Pupils: Pupils are equal, round, and reactive to light.  Cardiovascular:     Rate and Rhythm: Normal rate and regular rhythm.     Heart sounds: Normal heart sounds.  Pulmonary:     Effort: Pulmonary effort is normal. No respiratory distress.     Breath sounds: Normal breath sounds.     Comments: Localized area of tenderness with palpation overlying the right lateral inferior ribs.  No discrete crepitus or ecchymosis  noted.  No bony step-off appreciated.  No abdominal tenderness with palpation of the surrounding abdomen. Abdominal:     General: There is no distension.     Palpations: Abdomen is soft.     Tenderness: There is no abdominal tenderness.  Musculoskeletal:        General: No deformity. Normal range of motion.     Cervical back: Normal range of motion and neck supple.  Skin:    General: Skin is warm and dry.  Neurological:     General: No focal deficit present.     Mental Status: He is alert and oriented to person, place, and time.     ED Results / Procedures / Treatments   Labs (all labs ordered are listed, but only abnormal results are displayed) Labs Reviewed  CBC WITH DIFFERENTIAL/PLATELET - Abnormal; Notable for the following components:      Result Value   RBC 3.29 (*)    Hemoglobin 11.8 (*)    HCT 35.9 (*)    MCV 109.1 (*)    MCH 35.9 (*)    All other components within normal limits  COMPREHENSIVE METABOLIC PANEL - Abnormal; Notable for the following components:   Potassium 3.4 (*)    AST 107 (*)    ALT 65 (*)  All other components within normal limits  LIPASE, BLOOD    EKG None  Radiology US Abdomen Limited RUQ (LIVER/GB)  Result Date: 12/12/2022 CLINICAL DATA:  Right upper quadrant pain for 3 days, worse with movements EXAM: ULTRASOUND ABDOMEN LIMITED RIGHT UPPER QUADRANT COMPARISON:  None Available. FINDINGS: Gallbladder: No gallstones or wall thickening visualized. No sonographic Murphy sign noted by sonographer. Common bile duct: Diameter: 0.4 cm Liver: Simple benign cyst of the left lobe requiring no further follow-up or characterization. Geographically increased echogenicity. Portal vein is patent on color Doppler imaging with normal direction of blood flow towards the liver. Other: None. IMPRESSION: 1. No acute ultrasound findings of the right upper quadrant to explain pain. Consider CT or MRI to further evaluate otherwise unexplained abdominal pain. 2.  Diffuse, heterogeneous geographic hepatic steatosis. Electronically Signed   By: Jearld Lesch M.D.   On: 12/12/2022 11:41    Procedures Procedures    Medications Ordered in ED Medications - No data to display  ED Course/ Medical Decision Making/ A&P                                 Medical Decision Making Amount and/or Complexity of Data Reviewed Radiology: ordered.    Medical Screen Complete  This patient presented to the ED with complaint of right sided rib pain.  This complaint involves an extensive number of treatment options. The initial differential diagnosis includes, but is not limited tometabolic abnormality, muscular strain, pneumothorax, cholecystitis, etc.  This presentation is: Acute, Self-Limited, Previously Undiagnosed, Uncertain Prognosis, and Complicated  Patient is presenting with a complaint of right-sided rib pain.  Exam is most consistent with likely muscular strain.  Workup including labs and ultrasound of the right upper quadrant are without significant acute abnormality.  Imaging including x-ray is without acute abnormality.  Patient reassured by evaluation and workup results.  Importance of close follow-up is stressed.  Strict return precautions given and understood.    Additional history obtained: External records from outside sources obtained and reviewed including prior ED visits and prior Inpatient records.   Problem List / ED Course:  Right-sided rib pain   Reevaluation:  After the interventions noted above, I reevaluated the patient and found that they have: improved    Disposition:  After consideration of the diagnostic results and the patients response to treatment, I feel that the patent would benefit from close outpatient follow-up.          Final Clinical Impression(s) / ED Diagnoses Final diagnoses:  Contusion of rib on right side, initial encounter    Rx / DC Orders ED Discharge Orders     None          Wynetta Fines, MD 12/12/22 1615

## 2022-12-12 NOTE — ED Triage Notes (Signed)
Pt coming in today complaining of right sided abd pain that has been present for the 3x days. Denies any N/V/D. States pain increases with deep breaths.

## 2023-01-28 ENCOUNTER — Encounter (HOSPITAL_BASED_OUTPATIENT_CLINIC_OR_DEPARTMENT_OTHER): Payer: Self-pay | Admitting: Emergency Medicine

## 2023-01-28 ENCOUNTER — Emergency Department (HOSPITAL_BASED_OUTPATIENT_CLINIC_OR_DEPARTMENT_OTHER)
Admission: EM | Admit: 2023-01-28 | Discharge: 2023-01-28 | Disposition: A | Discharge status: Home / Self Care | Payer: 59 | Attending: Emergency Medicine | Admitting: Emergency Medicine

## 2023-01-28 ENCOUNTER — Other Ambulatory Visit: Payer: Self-pay

## 2023-01-28 ENCOUNTER — Other Ambulatory Visit (HOSPITAL_BASED_OUTPATIENT_CLINIC_OR_DEPARTMENT_OTHER): Payer: Self-pay

## 2023-01-28 DIAGNOSIS — K047 Periapical abscess without sinus: Secondary | ICD-10-CM | POA: Diagnosis not present

## 2023-01-28 DIAGNOSIS — R220 Localized swelling, mass and lump, head: Secondary | ICD-10-CM | POA: Diagnosis present

## 2023-01-28 MED ORDER — CLINDAMYCIN PHOSPHATE 900 MG/50ML IV SOLN
900.0000 mg | Freq: Once | INTRAVENOUS | Status: AC
  Administered 2023-01-28: 900 mg via INTRAVENOUS
  Filled 2023-01-28: qty 50

## 2023-01-28 MED ORDER — CLINDAMYCIN HCL 150 MG PO CAPS
450.0000 mg | ORAL_CAPSULE | Freq: Three times a day (TID) | ORAL | 0 refills | Status: AC
  Filled 2023-01-28: qty 63, 7d supply, fill #0

## 2023-01-28 NOTE — Discharge Instructions (Addendum)
Overall I suspect due to possibly have a developing abscess due to dental infection.  Take antibiotic as prescribed.  Follow-up with dentistry.  Return if symptoms

## 2023-01-28 NOTE — ED Provider Notes (Signed)
[facility] AT [facility] Provider Note   CSN: [id number] Arrival date & time: 01/28/23  1884     History  Chief Complaint  Patient presents with   Facial Swelling    Benjamin Larson is a 47 y.o. male.  Patient here with swelling to the right side of his mandible and face.  History of the same.  Denies any difficulty eating or drinking or opening his mouth.  He has had some dental infection similar in the past as well as some infected hair follicles in the past.  Denies any fever or chills.  Swelling of the face has been there for the last few days.  Denies any chest pain shortness of breath weakness numbness tingling.  Denies any major medical problems.  No history of diabetes.  The history is provided by the patient.       Home Medications Prior to Admission medications   Medication Sig Start Date End Date Taking? Authorizing Provider  clindamycin (CLEOCIN) 150 MG capsule Take 3 capsules (450 mg total) by mouth 3 (three) times daily for 7 days. 01/28/23 02/04/23 Yes Heer Justiss, DO      Allergies    Penicillins    Review of Systems   Review of Systems  Physical Exam Updated Vital Signs Pulse 85   Temp 98.1 F (36.7 C)   Resp 16   Ht 6' (1.829 m)   Wt 77.1 kg   SpO2 98%   BMI 23.06 kg/m  Physical Exam Vitals and nursing note reviewed.  Constitutional:      General: He is not in acute distress.    Appearance: He is well-developed. He is not ill-appearing.  HENT:     Head: Normocephalic and atraumatic.     Comments: Swelling over the right side of the mandible of the lower jaw, no trismus or drooling, no drainage, no folliculitis or obvious facial abscess Eyes:     Extraocular Movements: Extraocular movements intact.     Conjunctiva/sclera: Conjunctivae normal.     Pupils: Pupils are equal, round, and reactive to light.  Cardiovascular:     Rate and Rhythm: Normal rate and regular rhythm.     Pulses: Normal pulses.      Heart sounds: Normal heart sounds. No murmur heard. Pulmonary:     Effort: Pulmonary effort is normal. No respiratory distress.     Breath sounds: Normal breath sounds.  Abdominal:     Palpations: Abdomen is soft.     Tenderness: There is no abdominal tenderness.  Musculoskeletal:        General: No swelling. Normal range of motion.     Cervical back: Normal range of motion and neck supple.  Skin:    General: Skin is warm and dry.     Capillary Refill: Capillary refill takes less than 2 seconds.  Neurological:     Mental Status: He is alert.  Psychiatric:        Mood and Affect: Mood normal.     ED Results / Procedures / Treatments   Labs (all labs ordered are listed, but only abnormal results are displayed) Labs Reviewed - No data to display  EKG None  Radiology No results found.  Procedures Procedures    Medications Ordered in ED Medications  clindamycin (CLEOCIN) IVPB 900 mg (900 mg Intravenous New Bag/Given 01/28/23 1660)    ED Course/ Medical Decision Making/ A&P  Medical Decision Making Risk Prescription drug management.   Benjamin Larson is here for right-sided facial swelling.  Normal vitals.  No fever.  No significant comorbidities.  Overall looks like he has likely a developing dental infection/may be developing about a dental abscess.  I do not see any signs of an infected hair follicle or facial abscess that needs to be drained.  He has no major trismus or drooling.  Will give a dose IV clindamycin and send him home with the same.  Will have him follow-up with dentistry.  He has had swelling in this area similar to before that has responded to antibiotics in the past.  He is never really seen a dentist about it in the past.  Patient tolerated antibiotic well.  Discharged in good condition.  Understands return precautions.  This chart was dictated using voice recognition software.  Despite best efforts to proofread,  errors  can occur which can change the documentation meaning.         Final Clinical Impression(s) / ED Diagnoses Final diagnoses:  Dental infection    Rx / DC Orders ED Discharge Orders          Ordered    clindamycin (CLEOCIN) 150 MG capsule  3 times daily        01/28/23 2725              Virgina Norfolk, DO 01/28/23 1006

## 2023-01-28 NOTE — ED Triage Notes (Signed)
Rt sided facial swelling  since last wed worse over the weekend  hhas hx of infected hir follicle and  has a bad tooth that side also

## 2023-01-29 ENCOUNTER — Emergency Department (HOSPITAL_BASED_OUTPATIENT_CLINIC_OR_DEPARTMENT_OTHER)
Admission: EM | Admit: 2023-01-29 | Discharge: 2023-01-29 | Disposition: A | Discharge status: Home / Self Care | Payer: 59 | Attending: Emergency Medicine | Admitting: Emergency Medicine

## 2023-01-29 ENCOUNTER — Emergency Department (HOSPITAL_BASED_OUTPATIENT_CLINIC_OR_DEPARTMENT_OTHER): Payer: 59

## 2023-01-29 ENCOUNTER — Encounter (HOSPITAL_BASED_OUTPATIENT_CLINIC_OR_DEPARTMENT_OTHER): Payer: Self-pay | Admitting: Urology

## 2023-01-29 ENCOUNTER — Other Ambulatory Visit: Payer: Self-pay

## 2023-01-29 DIAGNOSIS — K047 Periapical abscess without sinus: Secondary | ICD-10-CM | POA: Insufficient documentation

## 2023-01-29 LAB — CBC WITH DIFFERENTIAL/PLATELET
Abs Immature Granulocytes: 0.03 10*3/uL (ref 0.00–0.07)
Basophils Absolute: 0 10*3/uL (ref 0.0–0.1)
Basophils Relative: 0 %
Eosinophils Absolute: 0 10*3/uL (ref 0.0–0.5)
Eosinophils Relative: 0 %
HCT: 36.6 % — ABNORMAL LOW (ref 39.0–52.0)
Hemoglobin: 12.4 g/dL — ABNORMAL LOW (ref 13.0–17.0)
Immature Granulocytes: 0 %
Lymphocytes Relative: 16 %
Lymphs Abs: 1.3 10*3/uL (ref 0.7–4.0)
MCH: 36.2 pg — ABNORMAL HIGH (ref 26.0–34.0)
MCHC: 33.9 g/dL (ref 30.0–36.0)
MCV: 106.7 fL — ABNORMAL HIGH (ref 80.0–100.0)
Monocytes Absolute: 0.9 10*3/uL (ref 0.1–1.0)
Monocytes Relative: 11 %
Neutro Abs: 6 10*3/uL (ref 1.7–7.7)
Neutrophils Relative %: 73 %
Platelets: 147 10*3/uL — ABNORMAL LOW (ref 150–400)
RBC: 3.43 MIL/uL — ABNORMAL LOW (ref 4.22–5.81)
RDW: 13.9 % (ref 11.5–15.5)
WBC: 8.3 10*3/uL (ref 4.0–10.5)
nRBC: 0 % (ref 0.0–0.2)

## 2023-01-29 LAB — BASIC METABOLIC PANEL
Anion gap: 16 — ABNORMAL HIGH (ref 5–15)
BUN: 9 mg/dL (ref 6–20)
CO2: 24 mmol/L (ref 22–32)
Calcium: 9.4 mg/dL (ref 8.9–10.3)
Chloride: 96 mmol/L — ABNORMAL LOW (ref 98–111)
Creatinine, Ser: 0.56 mg/dL — ABNORMAL LOW (ref 0.61–1.24)
GFR, Estimated: >60 mL/min (ref >60)
Glucose, Bld: 87 mg/dL (ref 70–99)
Potassium: 3.7 mmol/L (ref 3.5–5.1)
Sodium: 136 mmol/L (ref 135–145)

## 2023-01-29 MED ORDER — IOHEXOL 300 MG/ML  SOLN
75.0000 mL | Freq: Once | INTRAMUSCULAR | Status: AC | PRN
  Administered 2023-01-29: 75 mL via INTRAVENOUS

## 2023-01-29 MED ORDER — SODIUM CHLORIDE 0.9 % IV BOLUS: 1000.0000 mL | Freq: Once | INTRAVENOUS | Status: DC

## 2023-01-29 MED ORDER — HYDROCODONE-ACETAMINOPHEN 5-325 MG PO TABS: 1.0000 | ORAL_TABLET | Freq: Four times a day (QID) | ORAL | 0 refills | Status: AC | PRN

## 2023-01-29 NOTE — ED Triage Notes (Signed)
Pt seen yesterday for dental abscess, states getting worse  Now swelling in throat and eye, states spitting up blood now too  Started Clindamycin yesterday, taken 3 doses  Had IV yesterday as well

## 2023-01-29 NOTE — ED Notes (Signed)
Pt provided pitcher of water for oral rehydration.

## 2023-01-29 NOTE — ED Provider Notes (Signed)
[facility] Provider Note   CSN: [id number] Arrival date & time: 01/29/23  1904     History  Chief Complaint  Patient presents with   Dental Pain    WIN Benjamin Larson is a 47 y.o. male history of dental abscesses, seizures, alcohol use disorder presented with right-sided dental abscess.  Patient was seen yesterday and ultimately given 1 dose of IV clindamycin and discharged on p.o. and states has been able to take the p.o. clindamycin but states the swelling has gotten worse.  Patient states that from the abscess he is spitting out blood but denies blood thinners.  Patient states that his right face and right side of his neck appear swollen but has been able to tolerate food and fluids and denies any shortness of breath or wheezing.  Patient denies any fevers.  Patient denies chest pain, change in sensation/motor skills, neck pain, vision changes, headache, nausea/vomiting, abdominal pain  Home Medications Prior to Admission medications   Medication Sig Start Date End Date Taking? Authorizing Provider  clindamycin (CLEOCIN) 150 MG capsule Take 3 capsules (450 mg total) by mouth 3 (three) times daily for 7 days. 01/28/23 02/04/23  Virgina Norfolk, DO      Allergies    Penicillins    Review of Systems   Review of Systems  Physical Exam Updated Vital Signs BP (!) 165/101 (BP Location: Left Arm)   Pulse 95   Temp 98.4 F (36.9 C)   Resp 18   Ht 6' (1.829 m)   Wt 77.1 kg   SpO2 100%   BMI 23.05 kg/m  Physical Exam Constitutional:      General: He is not in acute distress. HENT:     Head:     Jaw: There is normal jaw occlusion.     Salivary Glands: Right salivary gland is not diffusely enlarged or tender. Left salivary gland is not diffusely enlarged or tender.     Comments: Right side face does appear swollen with abscess noted on right mandible that is tender to palpation however unable to express purulent material    Right  Ear: Hearing, tympanic membrane, ear canal and external ear normal.     Left Ear: Hearing, tympanic membrane, ear canal and external ear normal.     Nose: Nose normal.     Mouth/Throat:     Lips: Pink.     Mouth: Mucous membranes are moist.     Pharynx: Oropharynx is clear. Uvula midline.     Comments: Right side of mouth does appear edematous however I cannot locate an abscess No oral floor swelling Tolerating secretions No purulent drainage no No PTA noted No muffled voice noted Eyes:     Extraocular Movements: Extraocular movements intact.     Conjunctiva/sclera: Conjunctivae normal.     Pupils: Pupils are equal, round, and reactive to light.  Neck:     Comments: No neck swelling Cardiovascular:     Rate and Rhythm: Normal rate and regular rhythm.     Pulses: Normal pulses.     Heart sounds: Normal heart sounds.  Pulmonary:     Effort: Pulmonary effort is normal. No respiratory distress.     Breath sounds: Normal breath sounds.  Musculoskeletal:     Cervical back: Normal range of motion and neck supple. No rigidity or tenderness.  Skin:    General: Skin is warm and dry.     Findings: Erythema (Right mandible) present.  Neurological:  Mental Status: He is alert.     ED Results / Procedures / Treatments   Labs (all labs ordered are listed, but only abnormal results are displayed) Labs Reviewed  BASIC METABOLIC PANEL  CBC WITH DIFFERENTIAL/PLATELET    EKG None  Radiology No results found.  Procedures Procedures    Medications Ordered in ED Medications - No data to display  ED Course/ Medical Decision Making/ A&P                                 Medical Decision Making Amount and/or Complexity of Data Reviewed Labs: ordered. Radiology: ordered.  Risk Prescription drug management.   [name] 47 y.o. presented today for right-sided facial swelling. Working DDx that I considered at this time includes, but not limited to, dental abscess,  superficial abscess, deep space abscess, odontogenic infection, deep space infection, airway compromise, Ludwig's angina.  R/o DDx: superficial abscess, deep space abscess, airway compromise, Ludwig's angina: These are considered less likely due to history of present illness, physical exam, labs/imaging findings  Review of prior external notes: 01/28/2023 ED provider  Unique Tests and My Interpretation:  CBC: Unremarkable BMP: Anion gap 16 is most likely due to patient's alcohol use CT soft tissue: Abscess that goes from inferior right second molar extending onto the cheek with reactive lymph nodes  Discussion with Independent Historian:  Wife  Discussion of Management of Tests: None  Risk: Medium: prescription drug management  Risk Stratification Score: None  Plan: On exam patient was no acute distress with stable vitals.  On exam patient does have right-sided facial swelling however neck does not appear swollen.  Patient does have what appears to be an abscess as there is fluctuance to his right mandible or patient states he has had incision and drainage before due to infected follicle.  Patient states he is concerned that the dental abscess getting worse however he is only taking the clindamycin for 1 day and I spoke to the patient while he will most likely need more time for the antibiotics to kick in.  Patient does have dental appointment in 2 days however will get blood labs and CT scan to further assess patient's abscess.  Labs do show an anion gap most likely secondary to patient's chronic alcohol use.  Patient was given oral fluids as he is requesting something to drink away to help with anion gap as we are trying to conserve IV fluids at this time.  Patient's been able to tolerate fluids throughout ED stay for the past 4 hours.  CT scan does show infection from right second molar going out into his cheek.  I spoke to the patient and we agreed that the dentist will need to drain  this abscess as it is a deeper abscess and unsafe to drain in the ER.  Encourage patient to continue on the clindamycin and to use Tylenol ibuprofen every 6 hours needed for pain however will prescribe Norco to help with pain not controlled by this.  Encourage patient to use liquid diet and to avoid coarse foods that may irritate the abscess in his mouth.  Strict return precautions were discussed with patient as patient does not require admission at this time as he is able to tolerate fluids p.o. and has close follow-up however if symptoms are change or worsen in any way shape or form patient is to return to ER for further evaluation.  Patient  was given return precautions. Patient stable for discharge at this time.  Patient verbalized understanding of plan.         Final Clinical Impression(s) / ED Diagnoses Final diagnoses:  None    Rx / DC Orders ED Discharge Orders     None         Remi Deter 01/29/23 2309    Arby Barrette, MD 01/29/23 [phone]

## 2023-01-29 NOTE — Discharge Instructions (Addendum)
Please follow-up with your dentist in 2 days to have your dental abscess drained.  Today your CT scan shows that you have a dental abscess that is deep and goes out to your cheek and as we discussed that is why and that the dentist will need to drain as it is not safe to drain in the ER.  Please continue use your clindamycin at home.  You may use Tylenol every 6 hours needed for pain.  If her pain is not controlled by the Tylenol I have prescribed.  Norco to take.  Please have a liquid diet for the next 2 days and avoid course foods that would cause the abscess to rupture.  If symptoms change or worsen including trouble breathing, unable to swallow spit, etc.  Please return to ER.

## 2023-01-29 NOTE — ED Notes (Signed)
Pt transported to imaging.

## 2023-02-08 ENCOUNTER — Other Ambulatory Visit (HOSPITAL_BASED_OUTPATIENT_CLINIC_OR_DEPARTMENT_OTHER): Payer: Self-pay

## 2023-03-31 IMAGING — DX DG KNEE COMPLETE 4+V*L*
4 series · 4 of 4 positions shown · non-contrast
Comparison: None.

CLINICAL DATA: Pain and swelling

EXAM:
LEFT KNEE - COMPLETE 4+ VIEW

[knee ap (1 of 4)]
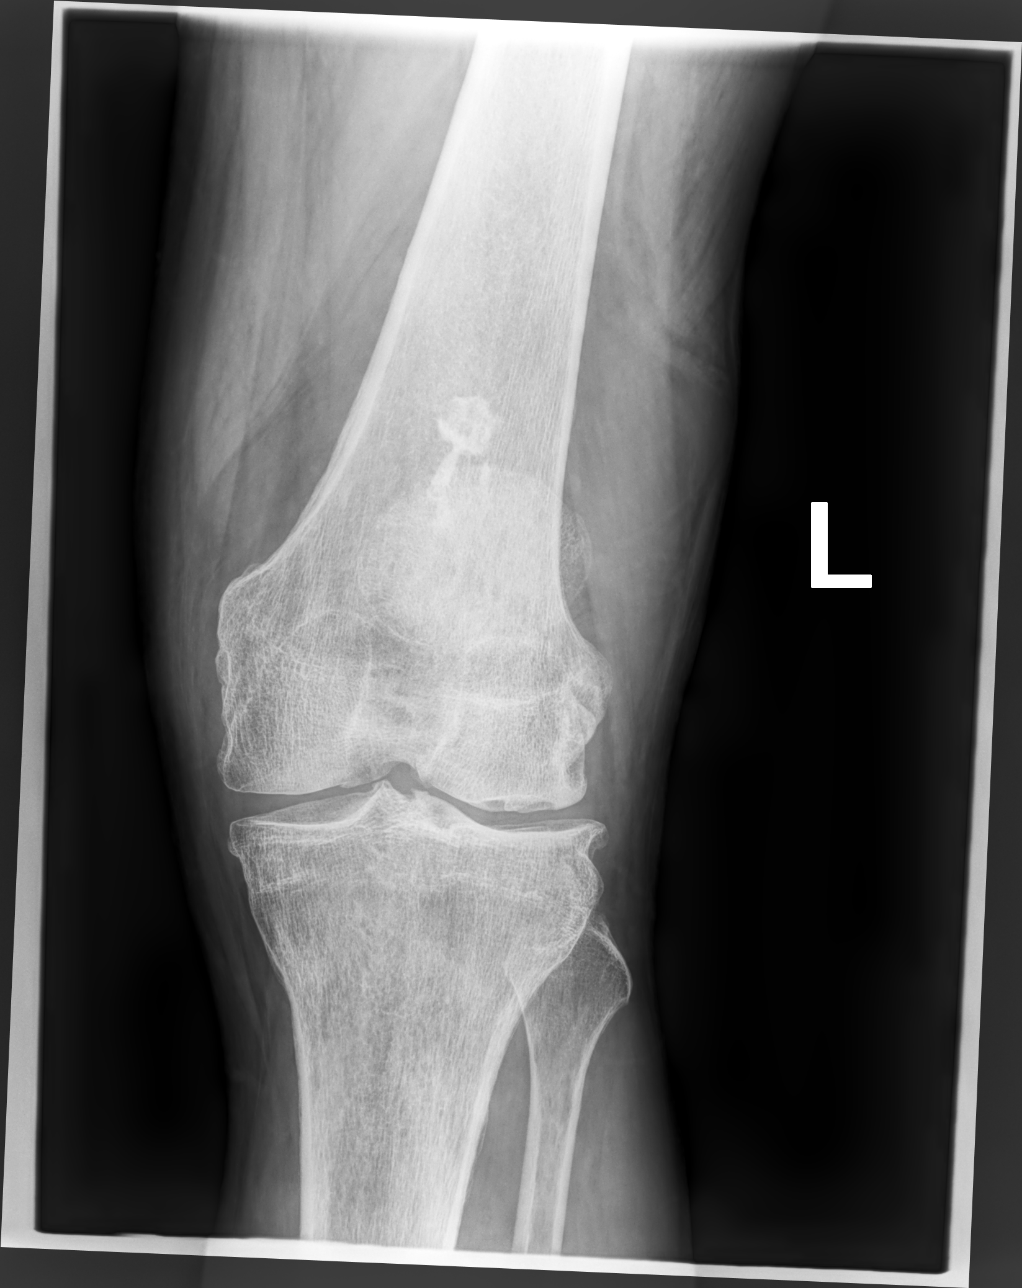

[knee ap (2 of 4)]
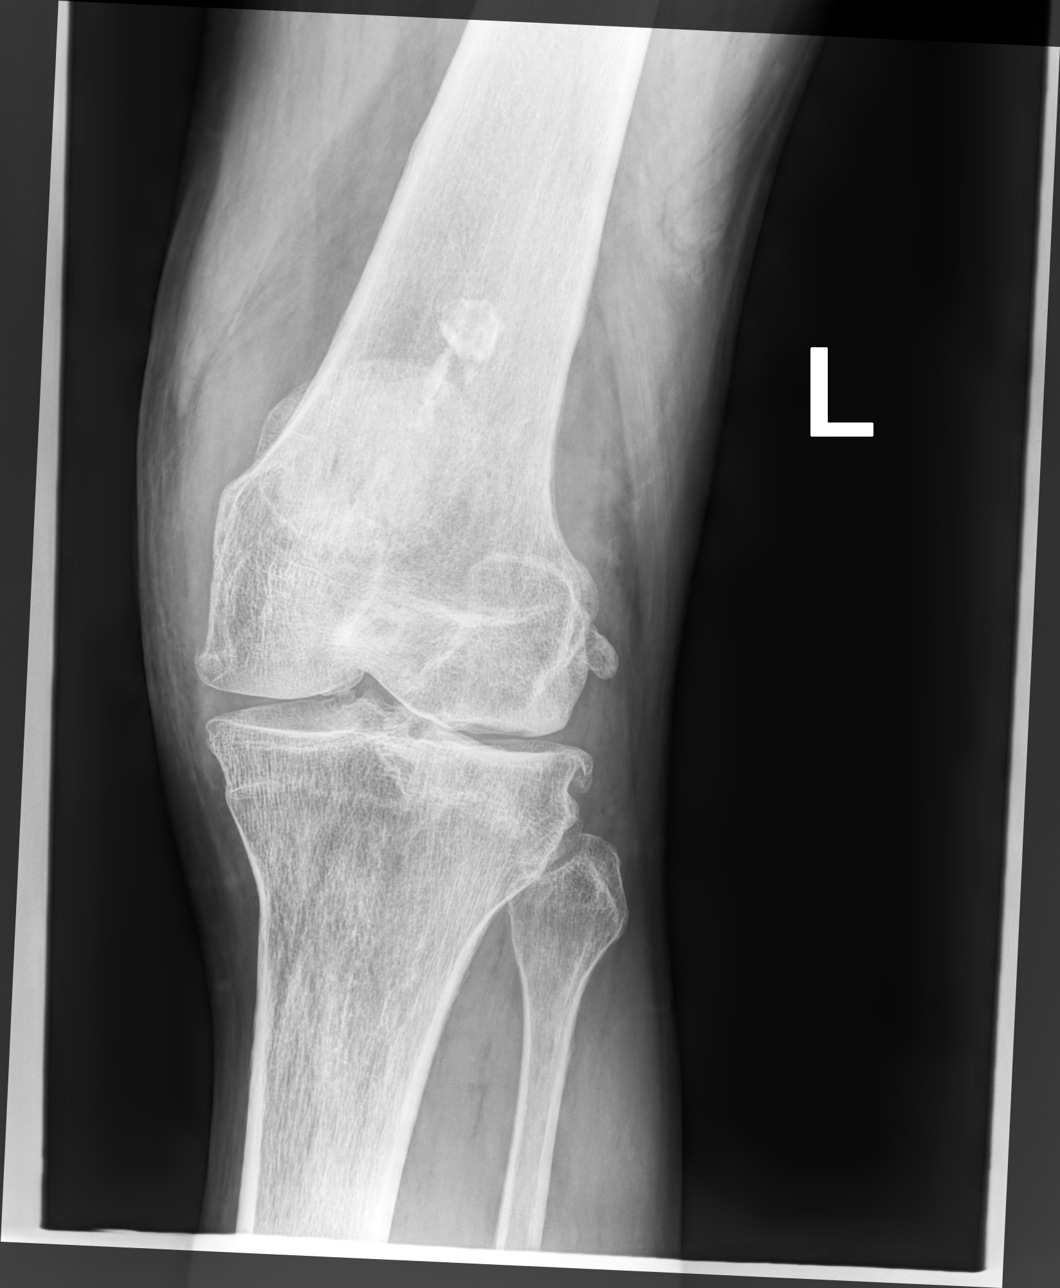

[knee ap (3 of 4)]
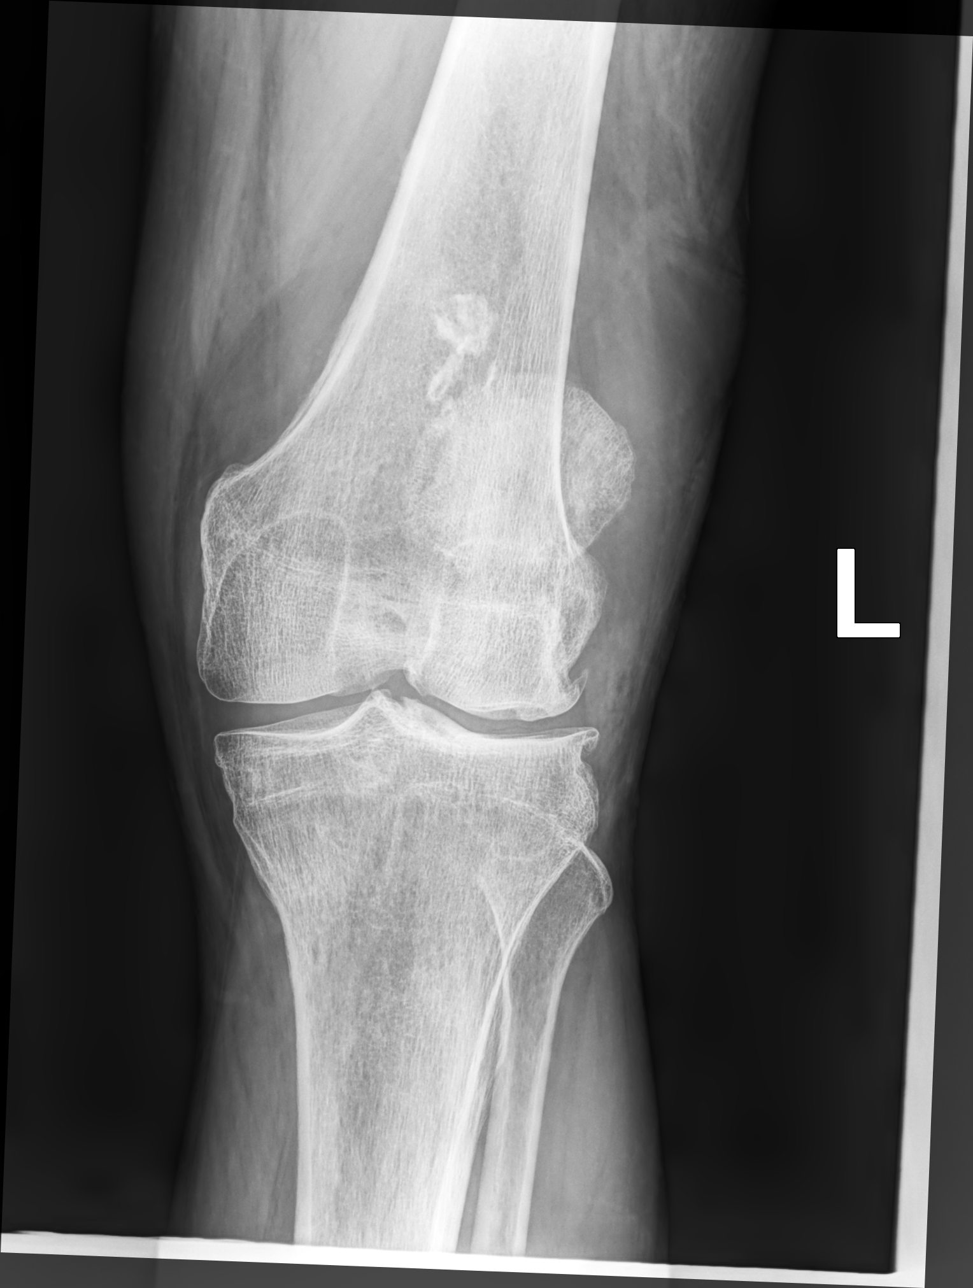

[knee ap (4 of 4)]
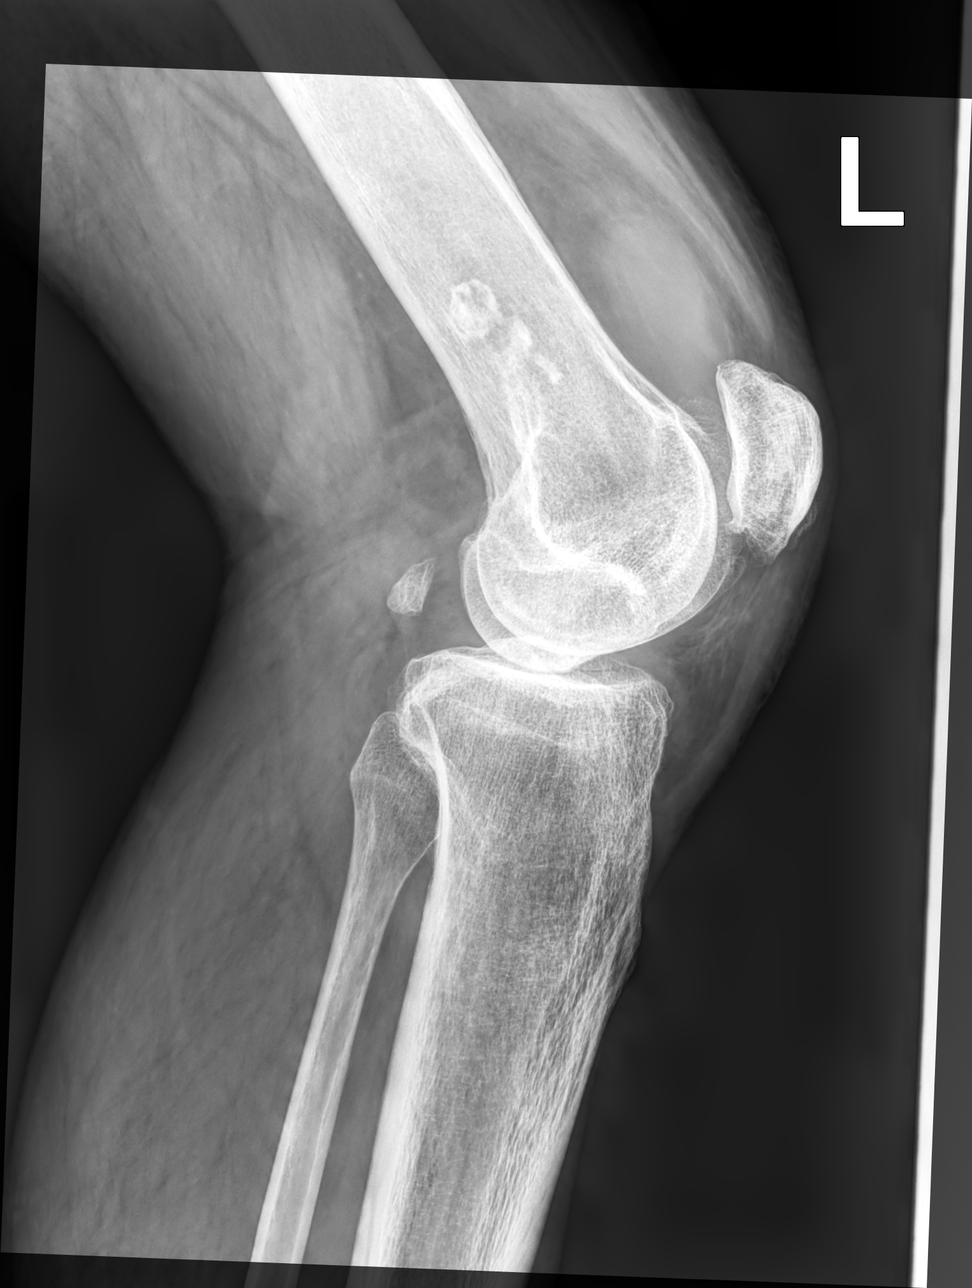

[4 of 4 positions shown; findings below may reference images not displayed]

FINDINGS: No evidence of fracture or dislocation. Moderate to large joint
effusion. Mild tricompartmental degenerative changes, most
pronounced in the lateral and patellofemoral compartment. Sclerosis
of the distal femur, likely due to prior bone infarct.
IMPRESSION: No acute osseous abnormality.

Moderate to large left knee joint effusion.

## 2024-04-12 ENCOUNTER — Ambulatory Visit: Admission: EM | Admit: 2024-04-12 | Discharge: 2024-04-12 | Disposition: A | Discharge status: Home / Self Care

## 2024-04-12 DIAGNOSIS — J069 Acute upper respiratory infection, unspecified: Secondary | ICD-10-CM | POA: Diagnosis not present

## 2024-04-12 DIAGNOSIS — J209 Acute bronchitis, unspecified: Secondary | ICD-10-CM | POA: Diagnosis not present

## 2024-04-12 LAB — POCT INFLUENZA A/B
Influenza A, POC: NEGATIVE
Influenza B, POC: NEGATIVE

## 2024-04-12 LAB — POC SOFIA SARS ANTIGEN FIA: SARS Coronavirus 2 Ag: NEGATIVE

## 2024-04-12 MED ORDER — PREDNISONE 50 MG PO TABS: ORAL_TABLET | ORAL | 0 refills | Status: AC

## 2024-04-12 MED ORDER — BENZONATATE 100 MG PO CAPS: 100.0000 mg | ORAL_CAPSULE | Freq: Three times a day (TID) | ORAL | 0 refills | Status: AC

## 2024-04-12 MED ORDER — GUAIFENESIN ER 600 MG PO TB12: 600.0000 mg | ORAL_TABLET | Freq: Two times a day (BID) | ORAL | 0 refills | Status: AC

## 2024-04-12 NOTE — ED Triage Notes (Signed)
 Benjamin Larson presents with fever, sluggish, sore throat, cough and keep anything down that started on Wednesday night. States he cough so much that his ribs hurt. Having night and day sweats. Treated with Mucinex  and Sudafed with little relief.

## 2024-04-12 NOTE — ED Provider Notes (Signed)
 " [facility]    CSN: [id number] Arrival date & time: 04/12/24  9163      History   Chief Complaint No chief complaint on file.   HPI Benjamin Larson is a 48 y.o. male.   Pt presents today due to cough productive of clear sputum, subjective fever, body aches, fatigue, chills, sweats, and reduced appetite since Wednesday. Pt states that he has been using Mucinex  and Sudafed with mild relief when taking properly. Pt states that he is coughing so hard his ribs are hurting. Pt denies known sick contacts.   The history is provided by the patient.    History reviewed. No pertinent past medical history.  Patient Active Problem List   Diagnosis Date Noted   Alcohol abuse 06/24/2018   Seizure (HCC) 06/24/2018   Tobacco abuse 06/24/2018    Past Surgical History:  Procedure Laterality Date   ACHILLES TENDON REPAIR Right    HERNIA REPAIR  2006   groin   PATELLAR TENDON REPAIR Right 2000       Home Medications    Prior to Admission medications  Medication Sig Start Date End Date Taking? Authorizing Provider  benzonatate  (TESSALON ) 100 MG capsule Take 1 capsule (100 mg total) by mouth every 8 (eight) hours. 04/12/24  Yes Andra Krabbe C, PA-C  guaiFENesin  (MUCINEX ) 600 MG 12 hr tablet Take 1 tablet (600 mg total) by mouth 2 (two) times daily for 10 days. 04/12/24 04/22/24 Yes Andra Krabbe BROCKS, PA-C  predniSONE  (DELTASONE ) 50 MG tablet Take 1 tab po daily for 5 days 04/12/24  Yes Andra Krabbe BROCKS, PA-C    Family History Family History  Problem Relation Age of Onset   Heart attack Mother    Stroke Mother    Diabetes Mother        pt thinks she has diabetes   Seizures Neg Hx     Social History Social History[1]   Allergies   Penicillins   Review of Systems Review of Systems   Physical Exam Triage Vital Signs ED Triage Vitals  Encounter Vitals Group     BP 04/12/24 0932 134/88     Girls Systolic BP Percentile --      Girls  Diastolic BP Percentile --      Boys Systolic BP Percentile --      Boys Diastolic BP Percentile --      Pulse Rate 04/12/24 0932 95     Resp 04/12/24 0932 18     Temp 04/12/24 0932 99.3 F (37.4 C)     Temp Source 04/12/24 0932 Oral     SpO2 04/12/24 0932 100 %     Weight 04/12/24 0931 176 lb (79.8 kg)     Height 04/12/24 0931 6' (1.829 m)     Head Circumference --      Peak Flow --      Pain Score 04/12/24 0931 7     Pain Loc --      Pain Education --      Exclude from Growth Chart --    No data found.  Updated Vital Signs BP 134/88 (BP Location: Left Arm)   Pulse 95   Temp 99.3 F (37.4 C) (Oral)   Resp 18   Ht 6' (1.829 m)   Wt 176 lb (79.8 kg)   SpO2 100%   BMI 23.87 kg/m   Visual Acuity Right Eye Distance:   Left Eye Distance:   Bilateral Distance:    Right Eye Near:  Left Eye Near:    Bilateral Near:     Physical Exam Vitals and nursing note reviewed.  Constitutional:      General: He is not in acute distress.    Appearance: Normal appearance. He is ill-appearing. He is not toxic-appearing or diaphoretic.  Eyes:     General: No scleral icterus. Cardiovascular:     Rate and Rhythm: Normal rate and regular rhythm.     Heart sounds: Normal heart sounds.  Pulmonary:     Effort: Pulmonary effort is normal. No respiratory distress.     Breath sounds: Normal breath sounds. No wheezing or rhonchi.  Skin:    General: Skin is warm.  Neurological:     Mental Status: He is alert and oriented to person, place, and time.  Psychiatric:        Mood and Affect: Mood normal.        Behavior: Behavior normal.      UC Treatments / Results  Labs (all labs ordered are listed, but only abnormal results are displayed) Labs Reviewed - No data to display  EKG   Radiology No results found.  Procedures Procedures (including critical care time)  Medications Ordered in UC Medications - No data to display  Initial Impression / Assessment and Plan / UC Course   I have reviewed the triage vital signs and the nursing notes.  Pertinent labs & imaging results that were available during my care of the patient were reviewed by me and considered in my medical decision making (see chart for details).     Final Clinical Impressions(s) / UC Diagnoses   Final diagnoses:  Viral URI  Acute bronchitis, unspecified organism   Discharge Instructions   None    ED Prescriptions     Medication Sig Dispense Auth. Provider   benzonatate  (TESSALON ) 100 MG capsule Take 1 capsule (100 mg total) by mouth every 8 (eight) hours. 30 capsule Andra Krabbe C, PA-C   guaiFENesin  (MUCINEX ) 600 MG 12 hr tablet Take 1 tablet (600 mg total) by mouth 2 (two) times daily for 10 days. 20 tablet Revella Shelton C, PA-C   predniSONE  (DELTASONE ) 50 MG tablet Take 1 tab po daily for 5 days 5 tablet Andra Krabbe BROCKS, PA-C      PDMP not reviewed this encounter.    [1]  Social History Tobacco Use   Smoking status: Every Day    Current packs/day: 0.50    Types: Cigarettes   Smokeless tobacco: Never  Vaping Use   Vaping status: Never Used  Substance Use Topics   Alcohol use: Yes    Alcohol/week: 28.0 - 35.0 standard drinks of alcohol    Types: 28 - 35 Standard drinks or equivalent per week    Comment: daily 4-5 drinks    Drug use: No     Andra Krabbe BROCKS, PA-C 04/12/24 9044  "
